# Patient Record
Sex: Female | Born: 1940 | Race: Black or African American | Hispanic: No | Marital: Single | Smoking: Never smoker
Health system: Southern US, Community
[De-identification: ages and names within clinical notes are randomized; demographics above are authoritative.]

## PROBLEM LIST (undated history)

## (undated) DIAGNOSIS — R609 Edema, unspecified: Secondary | ICD-10-CM

## (undated) DIAGNOSIS — E119 Type 2 diabetes mellitus without complications: Secondary | ICD-10-CM

## (undated) DIAGNOSIS — E079 Disorder of thyroid, unspecified: Secondary | ICD-10-CM

## (undated) DIAGNOSIS — D8989 Other specified disorders involving the immune mechanism, not elsewhere classified: Secondary | ICD-10-CM

## (undated) DIAGNOSIS — D869 Sarcoidosis, unspecified: Secondary | ICD-10-CM

## (undated) DIAGNOSIS — K219 Gastro-esophageal reflux disease without esophagitis: Secondary | ICD-10-CM

## (undated) DIAGNOSIS — I10 Essential (primary) hypertension: Secondary | ICD-10-CM

## (undated) DIAGNOSIS — K589 Irritable bowel syndrome without diarrhea: Secondary | ICD-10-CM

## (undated) DIAGNOSIS — J449 Chronic obstructive pulmonary disease, unspecified: Secondary | ICD-10-CM

## (undated) DIAGNOSIS — IMO0002 Reserved for concepts with insufficient information to code with codable children: Secondary | ICD-10-CM

## (undated) DIAGNOSIS — M1990 Unspecified osteoarthritis, unspecified site: Secondary | ICD-10-CM

## (undated) DIAGNOSIS — I509 Heart failure, unspecified: Secondary | ICD-10-CM

## (undated) DIAGNOSIS — G4733 Obstructive sleep apnea (adult) (pediatric): Secondary | ICD-10-CM

## (undated) DIAGNOSIS — M79609 Pain in unspecified limb: Secondary | ICD-10-CM

## (undated) DIAGNOSIS — K5730 Diverticulosis of large intestine without perforation or abscess without bleeding: Secondary | ICD-10-CM

## (undated) DIAGNOSIS — E785 Hyperlipidemia, unspecified: Secondary | ICD-10-CM

## (undated) HISTORY — DX: Chronic obstructive pulmonary disease, unspecified: J44.9

## (undated) HISTORY — DX: Heart failure, unspecified: I50.9

## (undated) HISTORY — DX: Diverticulosis of large intestine without perforation or abscess without bleeding: K57.30

## (undated) HISTORY — DX: Irritable bowel syndrome, unspecified: K58.9

## (undated) HISTORY — DX: Obstructive sleep apnea (adult) (pediatric): G47.33

## (undated) HISTORY — PX: HERNIA REPAIR: SHX51

## (undated) HISTORY — DX: Unspecified osteoarthritis, unspecified site: M19.90

## (undated) HISTORY — DX: Pain in unspecified limb: M79.609

## (undated) HISTORY — DX: Hyperlipidemia, unspecified: E78.5

## (undated) HISTORY — DX: Essential (primary) hypertension: I10

## (undated) HISTORY — DX: Reserved for concepts with insufficient information to code with codable children: IMO0002

## (undated) HISTORY — DX: Sarcoidosis, unspecified: D86.9

## (undated) HISTORY — PX: ABDOMINAL HYSTERECTOMY: SHX81

## (undated) HISTORY — DX: Disorder of thyroid, unspecified: E07.9

## (undated) HISTORY — DX: Other specified disorders involving the immune mechanism, not elsewhere classified: D89.89

## (undated) HISTORY — DX: Type 2 diabetes mellitus without complications: E11.9

## (undated) HISTORY — DX: Gastro-esophageal reflux disease without esophagitis: K21.9

## (undated) HISTORY — DX: Edema, unspecified: R60.9

## (undated) HISTORY — PX: CHOLECYSTECTOMY: SHX55

---

## 1997-12-22 ENCOUNTER — Other Ambulatory Visit: Admission: RE | Admit: 1997-12-22 | Discharge: 1997-12-22 | Payer: Self-pay | Admitting: Family Medicine

## 2000-04-25 HISTORY — PX: THYROID SURGERY: SHX805

## 2003-04-26 HISTORY — PX: CARDIAC CATHETERIZATION: SHX172

## 2003-07-07 ENCOUNTER — Inpatient Hospital Stay (HOSPITAL_COMMUNITY): Admission: AD | Admit: 2003-07-07 | Discharge: 2003-07-08 | Payer: Self-pay | Admitting: Cardiovascular Disease

## 2005-08-27 IMAGING — CT CT CHEST W/ CM
1 of 2 series · 15 of 30 positions shown, 19 images · IV contrast ([ID] OMNI 300)
Comparison: none

CLINICAL DATA: Chest pain.  Negative cardiac cath on 07/07/03.  Had reaction of severe vomiting to previous contrast.  Preop treatment with 25 mg PO Benadryl, 60 mg PO prednisone and 40 mg PO Pepcid.  
 CT SCAN OF THE CHEST WITH CONTRAST PULMONARY EMBOLUS PROTOCOL
 The patient was injected with 150 cc of Omnipaque 300 and scans of the entire chest were made and showed no definite pulmonary emboli.  This study is not optimal due to the patient?s large size.  There is noted some cardiomegaly.  The major vessels appear normal.  No abnormal lymph nodes are seen.  There is some scarring associated with the right oblique fissure posteriorly.  The tracheobronchial tree appears to be patent.  Bony thorax shows degenerative hypertrophic spurs of the mid and lower thoracic spine.  No evidence of fracture or metastatic disease is seen associated with the thorax.  There is no pericardial or pleural effusion.  
 IMPRESSION
 No evidence of pulmonary emboli.  This study is not optimal due to the patient?s large size.  There is some thickening and scarring associated with the right oblique fissure.  Heart is moderately enlarged with no evidence of pericardial or pleural effusion.

[Series 2: — · axial · 0.66mm/px · z∈[-245,-25]mm · 15 of 199 slices shown, 19 images]
[im 12/199  mediastinal]
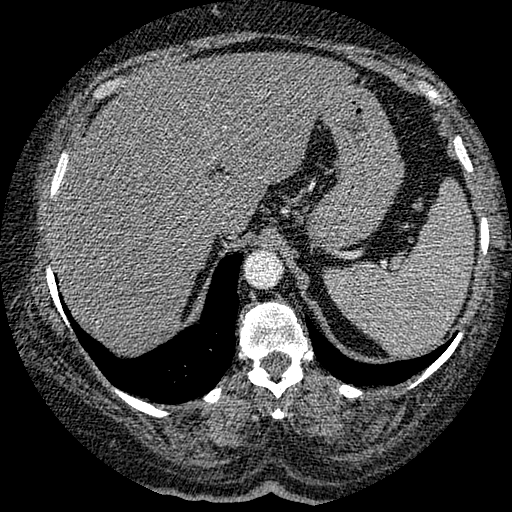
[im 12/199  lung]
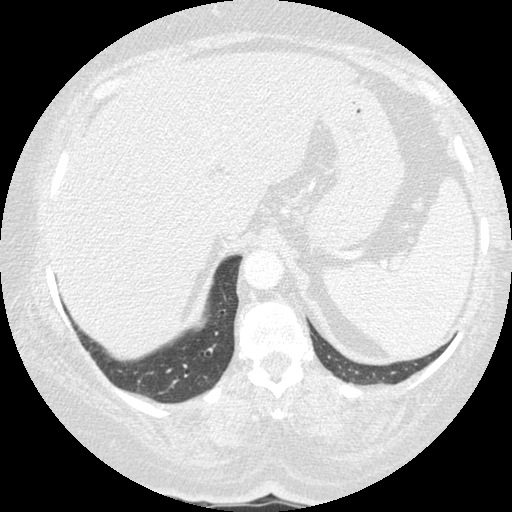
[im 23/199  lung]
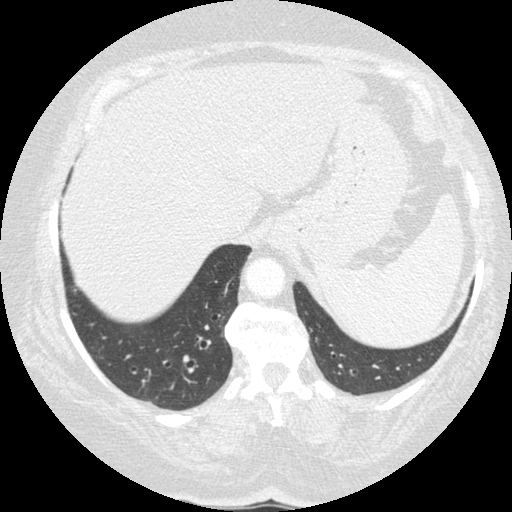
[im 45/199  lung]
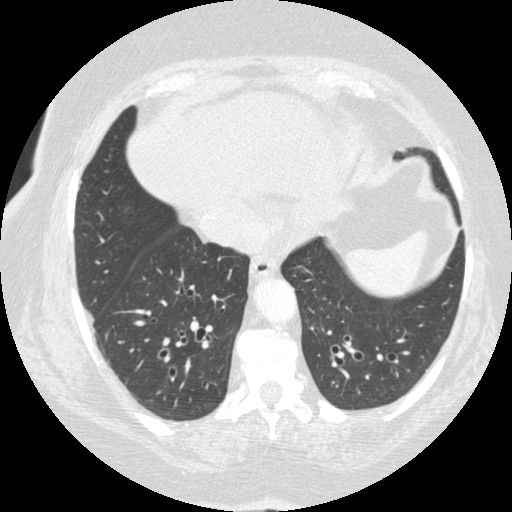
[im 56/199  lung]
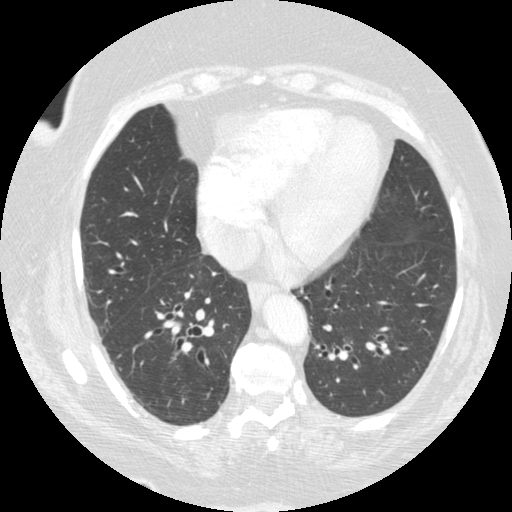
[im 67/199  mediastinal]
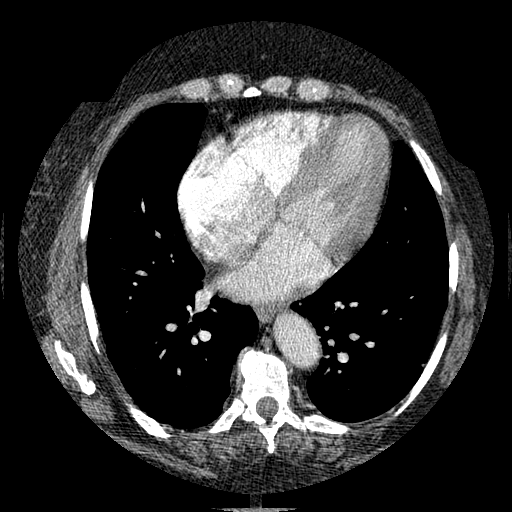
[im 67/199  lung]
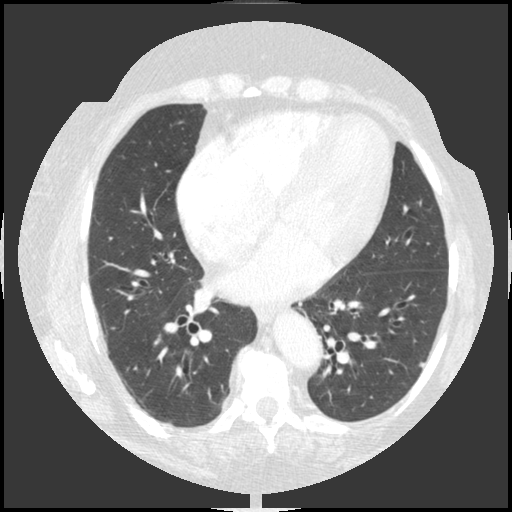
[im 78/199  lung]
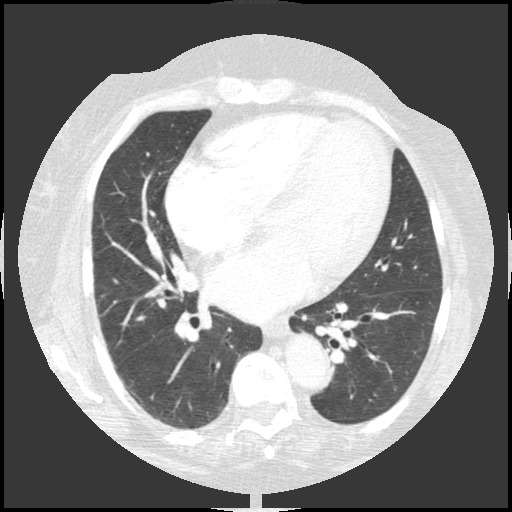
[im 89/199  lung]
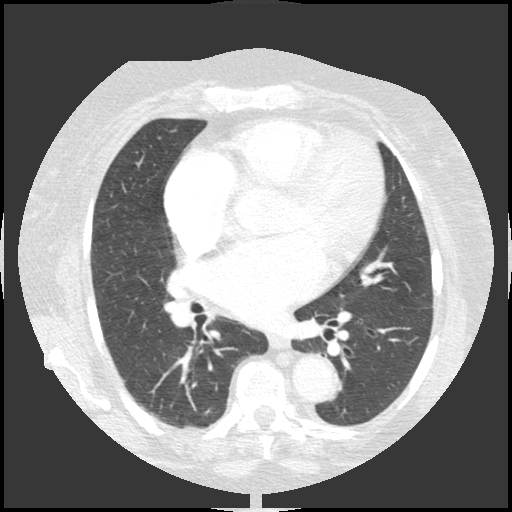
[im 100/199  lung]
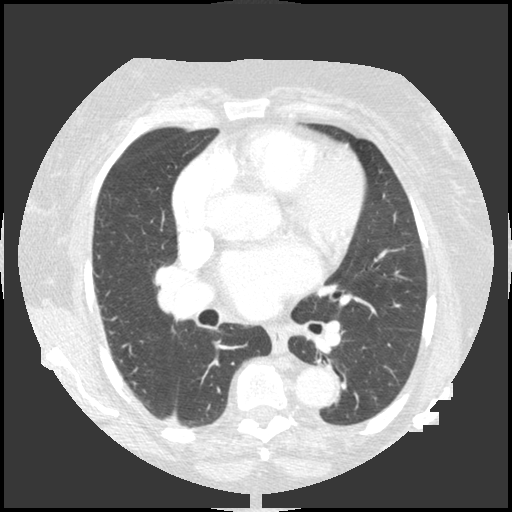
[im 111/199  mediastinal]
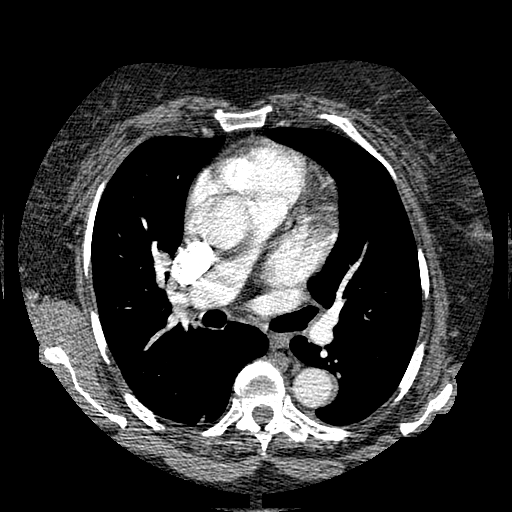
[im 111/199  lung]
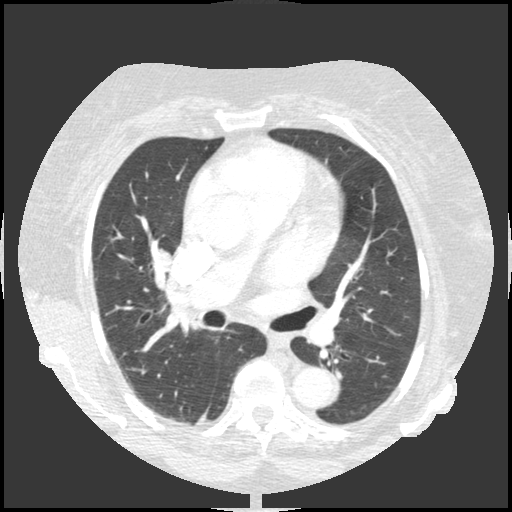
[im 122/199  lung]
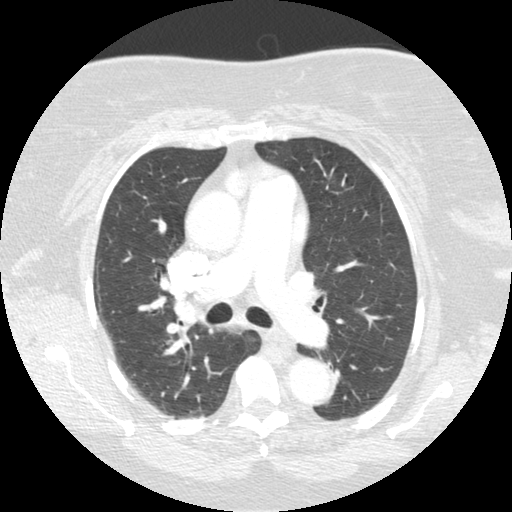
[im 133/199  lung]
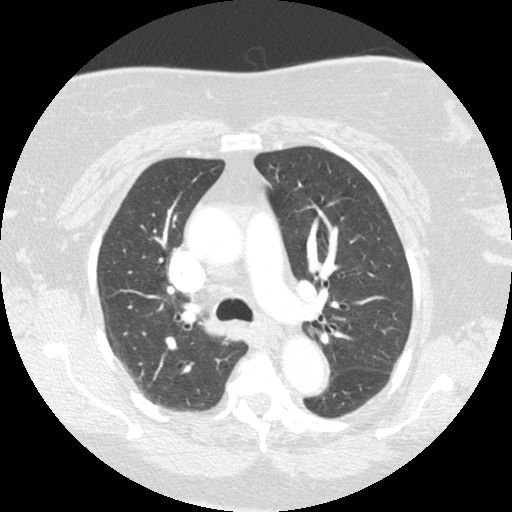
[im 144/199  lung]
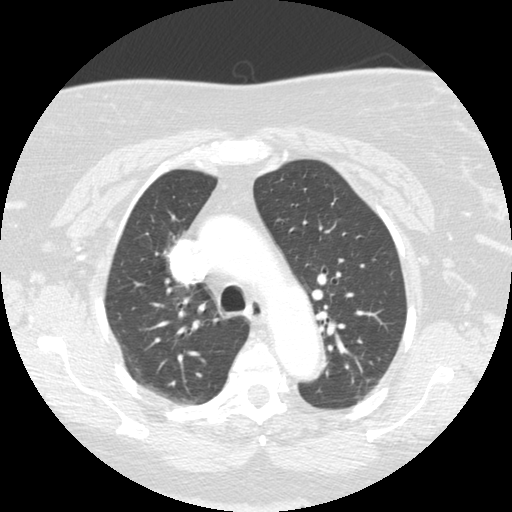
[im 166/199  mediastinal]
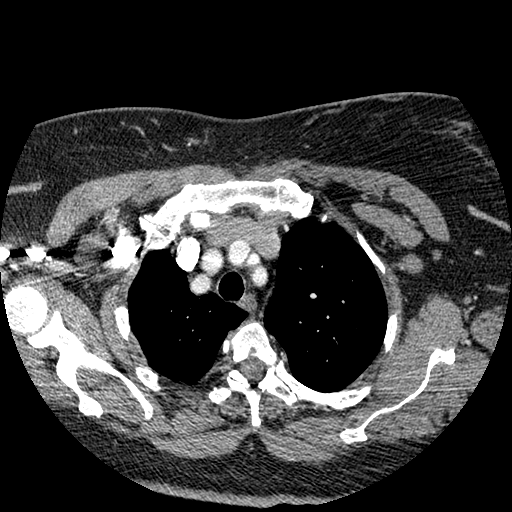
[im 166/199  lung]
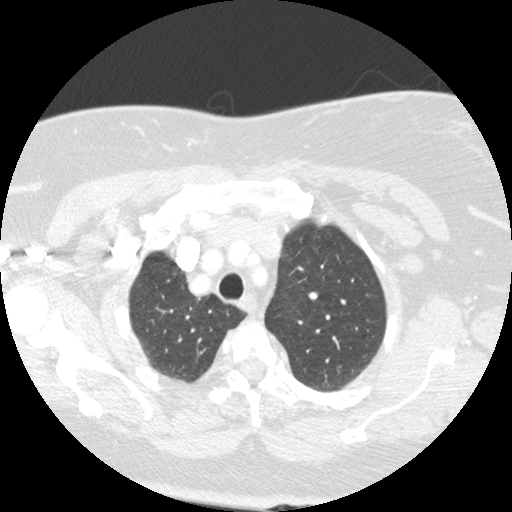
[im 177/199  lung]
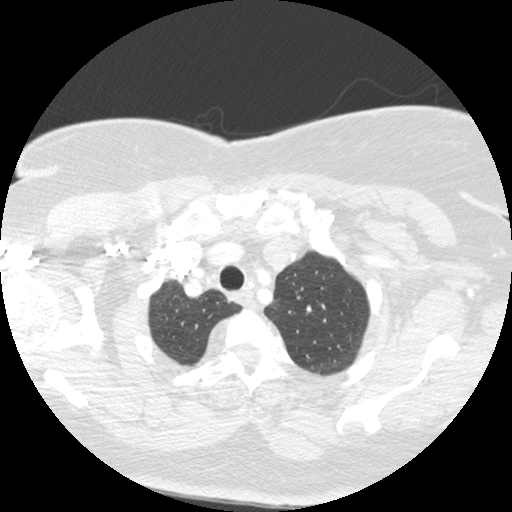
[im 188/199  lung]
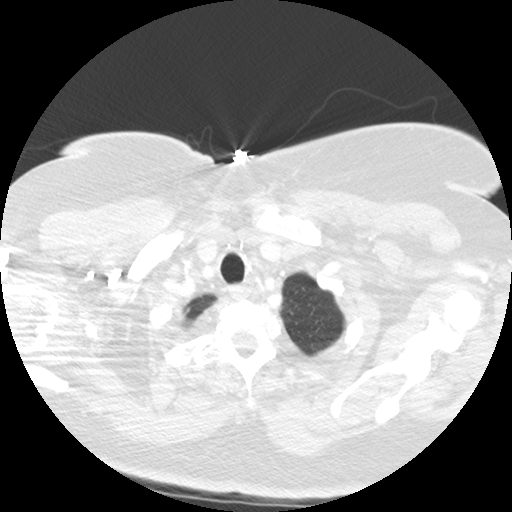

[15 of 30 positions shown; findings below may reference images not displayed]

## 2006-04-25 HISTORY — PX: JOINT REPLACEMENT: SHX530

## 2007-04-26 HISTORY — PX: JOINT REPLACEMENT: SHX530

## 2008-04-25 HISTORY — PX: CARPAL TUNNEL RELEASE: SHX101

## 2011-06-28 ENCOUNTER — Encounter: Payer: Self-pay | Admitting: Vascular Surgery

## 2011-07-01 ENCOUNTER — Encounter: Payer: Self-pay | Admitting: Vascular Surgery

## 2013-03-28 HISTORY — PX: BLADDER SUSPENSION: SHX72

## 2013-09-23 ENCOUNTER — Other Ambulatory Visit: Payer: Self-pay | Admitting: *Deleted

## 2013-09-23 DIAGNOSIS — M7989 Other specified soft tissue disorders: Secondary | ICD-10-CM

## 2013-11-04 ENCOUNTER — Encounter: Payer: Self-pay | Admitting: Vascular Surgery

## 2013-11-05 ENCOUNTER — Encounter: Payer: Self-pay | Admitting: Vascular Surgery

## 2013-11-05 ENCOUNTER — Ambulatory Visit (INDEPENDENT_AMBULATORY_CARE_PROVIDER_SITE_OTHER): Payer: Medicare Other | Admitting: Vascular Surgery

## 2013-11-05 ENCOUNTER — Ambulatory Visit (HOSPITAL_COMMUNITY)
Admission: RE | Admit: 2013-11-05 | Discharge: 2013-11-05 | Disposition: A | Discharge status: Home / Self Care | Payer: Medicare Other | Source: Ambulatory Visit | Attending: Vascular Surgery | Admitting: Vascular Surgery

## 2013-11-05 ENCOUNTER — Other Ambulatory Visit: Payer: Self-pay | Admitting: Vascular Surgery

## 2013-11-05 VITALS — BP 121/65 | HR 80 | Temp 98.4°F | Resp 18 | Ht 67.0 in | Wt 280.0 lb

## 2013-11-05 DIAGNOSIS — R600 Localized edema: Secondary | ICD-10-CM

## 2013-11-05 DIAGNOSIS — R609 Edema, unspecified: Secondary | ICD-10-CM

## 2013-11-05 DIAGNOSIS — M7989 Other specified soft tissue disorders: Secondary | ICD-10-CM

## 2013-11-05 DIAGNOSIS — I87303 Chronic venous hypertension (idiopathic) without complications of bilateral lower extremity: Secondary | ICD-10-CM | POA: Insufficient documentation

## 2013-11-05 NOTE — Progress Notes (Signed)
Subjective:     Patient ID: Madison Burnett, female   DOB: July 19, 1940, 73 y.o.   MRN: [id number]  HPI this 73 year old female is referred by Dr.Prochnau for evaluation of bilateral lower extremity edema. Patient has no history of DVT or thrombophlebitis. She dates that both legs have been increasingly swollen over the last several months. She does occasionally develop "cellulitis" in the right ankle. She currently does not have any infection. She does not elevate her legs a regular basis but does occasionally wear elastic compression stockings.  Past Medical History  Diagnosis Date  . Diabetes mellitus without complication   . Pain in limb   . Hypertension   . Edema   . Other specified disorders involving the immune mechanism   . Arthritis   . GERD (gastroesophageal reflux disease)   . OSA (obstructive sleep apnea)   . COPD (chronic obstructive pulmonary disease)   . Sarcoidosis   . IBS (irritable bowel syndrome)   . Hyperlipidemia   . Thyroid disease     Hypothyroidism  . Cystocele   . Diverticulosis of colon   . CHF (congestive heart failure)     Diastolic CHF    History  Substance Use Topics  . Smoking status: Never Smoker   . Smokeless tobacco: Not on file  . Alcohol Use: Not on file    Family History  Problem Relation Age of Onset  . Heart disease Father   . Cancer Son   . Diabetes Son     Allergies  Allergen Reactions  . Actos [Pioglitazone]   . Farxiga [Dapagliflozin]   . Metformin And Related   . Victoza [Liraglutide]     Current outpatient prescriptions:albuterol (PROVENTIL HFA;VENTOLIN HFA) 108 (90 BASE) MCG/ACT inhaler, Inhale into the lungs every 6 (six) hours as needed for wheezing or shortness of breath., Disp: , Rfl: ;  allopurinol (ZYLOPRIM) 300 MG tablet, Take 300 mg by mouth daily., Disp: , Rfl: ;  aspirin 81 MG tablet, Take 81 mg by mouth daily., Disp: , Rfl:  azelastine (ASTELIN) 0.1 % nasal spray, Place into both nostrils 2 (two) times daily. Use in  each nostril as directed, Disp: , Rfl: ;  EPINEPHrine (EPIPEN) 0.3 mg/0.3 mL IJ SOAJ injection, Inject into the muscle once., Disp: , Rfl: ;  Fe Fum-FA-B Cmp-C-Zn-Mg-Mn-Cu (FERROCITE PLUS) 106-1 MG TABS, Take by mouth., Disp: , Rfl: ;  FLUTICASONE PROPIONATE, INHAL, IN, Inhale into the lungs., Disp: , Rfl:  folic acid (FOLVITE) 1 MG tablet, Take 1 mg by mouth daily., Disp: , Rfl: ;  HYDROcodone-acetaminophen (NORCO/VICODIN) 5-325 MG per tablet, Take 1 tablet by mouth every 6 (six) hours as needed for moderate pain., Disp: , Rfl: ;  Insulin Aspart (NOVOLOG FLEXPEN [place]), Inject into the skin., Disp: , Rfl: ;  insulin detemir (LEVEMIR) 100 UNIT/ML injection, Inject 70 Units into the skin at bedtime., Disp: , Rfl:  isosorbide mononitrate (IMDUR) 60 MG 24 hr tablet, Take 60 mg by mouth daily., Disp: , Rfl: ;  levothyroxine (SYNTHROID, LEVOTHROID) 175 MCG tablet, Take 175 mcg by mouth daily before breakfast., Disp: , Rfl: ;  metoprolol succinate (TOPROL-XL) 100 MG 24 hr tablet, Take 100 mg by mouth daily. Take with or immediately following a meal., Disp: , Rfl: ;  omeprazole (PRILOSEC) 40 MG capsule, Take 40 mg by mouth daily., Disp: , Rfl:  polyethylene glycol (MIRALAX / GLYCOLAX) packet, Take 17 g by mouth daily., Disp: , Rfl: ;  ranitidine (ZANTAC) 300 MG tablet, Take 300 mg by  mouth at bedtime., Disp: , Rfl: ;  ranolazine (RANEXA) 500 MG 12 hr tablet, Take 500 mg by mouth 2 (two) times daily., Disp: , Rfl: ;  tizanidine (ZANAFLEX) 2 MG capsule, Take 2 mg by mouth 3 (three) times daily., Disp: , Rfl: ;  torsemide (DEMADEX) 100 MG tablet, Take 100 mg by mouth daily., Disp: , Rfl:  zolpidem (AMBIEN) 10 MG tablet, Take 10 mg by mouth at bedtime as needed for sleep., Disp: , Rfl:   BP 121/65  Pulse 80  Temp(Src) 98.4 F (36.9 C) (Oral)  Resp 18  Ht 5\' 7"  (1.702 m)  Wt 280 lb (127.007 kg)  BMI 43.84 kg/m2  SpO2 98%  Body mass index is 43.84 kg/(m^2).           Review of Systems as palpitations but  does complain of occasional chest pain, chest pressure, dyspnea on exertion, leg pain with walking, leg swelling, asthma, wheezing, weakness and numbness in arms and legs, skin rashes. Other systems negative complete review of systems     Objective:   Physical Exam BP 121/65  Pulse 80  Temp(Src) 98.4 F (36.9 C) (Oral)  Resp 18  Ht 5\' 7"  (1.702 m)  Wt 280 lb (127.007 kg)  BMI 43.84 kg/m2  SpO2 98%  Gen.-alert and oriented x3 in no apparent distress-obese HEENT normal for age Lungs no rhonchi or wheezing Cardiovascular regular rhythm no murmurs carotid pulses 3+ palpable no bruits audible Abdomen soft nontender no palpable masses Musculoskeletal free of  major deformities Skin clear -no rashes Neurologic normal Lower extremities 3+ femoral and dorsalis pedis pulses palpable bilaterally with 1+ edema bilaterally. No active ulcerations. No hyperpigmentation. No bulging varicosities noted. No evidence of cellulitis at the present time.  Today I ordered bilateral venous duplex exam which I reviewed and interpreted. There is some reflux in the left great saphenous and right small saphenous veins with borderline size veins. There is some deep reflux in both legs as well. There is no DVT.       Assessment:     Chronic bilateral edema right worse than left-etiology most likely deep venous reflux Patient does have some reflux and superficial venous system-left great saphenous and right small saphenous but I do not think laser ablation of these veins will significantly changed her situation at the present time.    Plan:     I recommended #1 elevate for the bed 4 inches so that she has good elevation all-night to help relieve the edema #2 short-leg elastic compression stockings to be placed on by her aid first laying in the morning before rising #3 no further vascular workup indicated #4 return to see me on a when necessary basis

## 2014-08-20 DIAGNOSIS — M1612 Unilateral primary osteoarthritis, left hip: Secondary | ICD-10-CM | POA: Insufficient documentation

## 2014-08-20 DIAGNOSIS — M1610 Unilateral primary osteoarthritis, unspecified hip: Secondary | ICD-10-CM | POA: Insufficient documentation

## 2014-10-06 NOTE — Patient Outreach (Signed)
[facility] Care Management  10/06/2014  Madison Burnett [birth year] [id number]   Referral and MD notes provided based from High Risk List, assigned [name], RN.  [name]. [name] [facility] Care Management [facility] CM Assistant Phone: [phone] Fax: [phone]

## 2014-10-17 DIAGNOSIS — Z01818 Encounter for other preprocedural examination: Secondary | ICD-10-CM | POA: Insufficient documentation

## 2014-10-17 DIAGNOSIS — M25552 Pain in left hip: Secondary | ICD-10-CM | POA: Insufficient documentation

## 2014-10-23 ENCOUNTER — Other Ambulatory Visit: Payer: Self-pay

## 2014-10-23 DIAGNOSIS — E119 Type 2 diabetes mellitus without complications: Secondary | ICD-10-CM | POA: Insufficient documentation

## 2014-10-23 DIAGNOSIS — N183 Chronic kidney disease, stage 3 unspecified: Secondary | ICD-10-CM | POA: Insufficient documentation

## 2014-10-23 DIAGNOSIS — I2510 Atherosclerotic heart disease of native coronary artery without angina pectoris: Secondary | ICD-10-CM | POA: Insufficient documentation

## 2014-10-23 DIAGNOSIS — I5032 Chronic diastolic (congestive) heart failure: Secondary | ICD-10-CM | POA: Insufficient documentation

## 2014-10-23 DIAGNOSIS — I129 Hypertensive chronic kidney disease with stage 1 through stage 4 chronic kidney disease, or unspecified chronic kidney disease: Secondary | ICD-10-CM | POA: Insufficient documentation

## 2014-10-23 DIAGNOSIS — E785 Hyperlipidemia, unspecified: Secondary | ICD-10-CM | POA: Insufficient documentation

## 2014-10-23 NOTE — Patient Outreach (Addendum)
[facility]) Care Management  10/23/2014  Madison AversClara Burnett 07/26/1940 [id number]   SUBJECTIVE:  Telephone call to patient regarding high risk list.  HIPAA verified with patient. Discussed and [facility]. Patient states she is presently receiving services from [facility].  Patient refused services at this time.  Patient verbalized agreement to receive [facility] outreach letter and pamphlet.     PLAN: RNCM will refer patient to Nena PolioLisa Moore to close due to refusal of services. RNCM will notify Dr. Sudie BaileyProchnau of patients refusal of services. RNCM will send patient outreach letter and pamphlet as requested.   George InaDavina Jasmyn Picha RN,BSN,CCM [facility] Telephonic Care Coordinator [phone]

## 2014-10-29 NOTE — Patient Outreach (Signed)
[facility]) Care Management  10/29/2014  Jessee AversClara Strohm 09/15/1940 [id number]   Notification from George Inaavina Green, RN to close case due to patient refused [facility].  Corrie MckusickLisa O. Sharlee BlewMoore, AABA [facility] CM Assistant Phone: [phone] Fax: [phone]

## 2014-11-13 DIAGNOSIS — R269 Unspecified abnormalities of gait and mobility: Secondary | ICD-10-CM | POA: Insufficient documentation

## 2015-01-30 DIAGNOSIS — M25571 Pain in right ankle and joints of right foot: Secondary | ICD-10-CM | POA: Insufficient documentation

## 2015-02-07 DIAGNOSIS — M51369 Other intervertebral disc degeneration, lumbar region without mention of lumbar back pain or lower extremity pain: Secondary | ICD-10-CM | POA: Insufficient documentation

## 2015-02-07 DIAGNOSIS — M4726 Other spondylosis with radiculopathy, lumbar region: Secondary | ICD-10-CM | POA: Insufficient documentation

## 2015-02-07 DIAGNOSIS — M5416 Radiculopathy, lumbar region: Secondary | ICD-10-CM | POA: Insufficient documentation

## 2015-02-07 DIAGNOSIS — M5136 Other intervertebral disc degeneration, lumbar region: Secondary | ICD-10-CM | POA: Insufficient documentation

## 2015-05-24 DIAGNOSIS — K589 Irritable bowel syndrome without diarrhea: Secondary | ICD-10-CM | POA: Insufficient documentation

## 2015-05-24 DIAGNOSIS — M9080 Osteopathy in diseases classified elsewhere, unspecified site: Secondary | ICD-10-CM

## 2015-05-24 DIAGNOSIS — M109 Gout, unspecified: Secondary | ICD-10-CM | POA: Insufficient documentation

## 2015-05-24 DIAGNOSIS — I2510 Atherosclerotic heart disease of native coronary artery without angina pectoris: Secondary | ICD-10-CM | POA: Insufficient documentation

## 2015-05-24 DIAGNOSIS — E1142 Type 2 diabetes mellitus with diabetic polyneuropathy: Secondary | ICD-10-CM | POA: Insufficient documentation

## 2015-05-24 DIAGNOSIS — J45909 Unspecified asthma, uncomplicated: Secondary | ICD-10-CM | POA: Insufficient documentation

## 2015-05-24 DIAGNOSIS — M2010 Hallux valgus (acquired), unspecified foot: Secondary | ICD-10-CM | POA: Insufficient documentation

## 2015-05-24 DIAGNOSIS — E876 Hypokalemia: Secondary | ICD-10-CM | POA: Insufficient documentation

## 2015-05-24 DIAGNOSIS — Z9989 Dependence on other enabling machines and devices: Secondary | ICD-10-CM

## 2015-05-24 DIAGNOSIS — K649 Unspecified hemorrhoids: Secondary | ICD-10-CM | POA: Insufficient documentation

## 2015-05-24 DIAGNOSIS — D869 Sarcoidosis, unspecified: Secondary | ICD-10-CM | POA: Insufficient documentation

## 2015-05-24 DIAGNOSIS — R609 Edema, unspecified: Secondary | ICD-10-CM | POA: Insufficient documentation

## 2015-05-24 DIAGNOSIS — E6609 Other obesity due to excess calories: Secondary | ICD-10-CM | POA: Insufficient documentation

## 2015-05-24 DIAGNOSIS — E782 Mixed hyperlipidemia: Secondary | ICD-10-CM | POA: Insufficient documentation

## 2015-05-24 DIAGNOSIS — B351 Tinea unguium: Secondary | ICD-10-CM | POA: Insufficient documentation

## 2015-05-24 DIAGNOSIS — B353 Tinea pedis: Secondary | ICD-10-CM | POA: Insufficient documentation

## 2015-05-24 DIAGNOSIS — G4733 Obstructive sleep apnea (adult) (pediatric): Secondary | ICD-10-CM | POA: Insufficient documentation

## 2015-05-24 DIAGNOSIS — E559 Vitamin D deficiency, unspecified: Secondary | ICD-10-CM | POA: Insufficient documentation

## 2015-05-24 DIAGNOSIS — E889 Metabolic disorder, unspecified: Secondary | ICD-10-CM | POA: Insufficient documentation

## 2015-05-24 DIAGNOSIS — E039 Hypothyroidism, unspecified: Secondary | ICD-10-CM | POA: Insufficient documentation

## 2015-05-24 DIAGNOSIS — R002 Palpitations: Secondary | ICD-10-CM | POA: Insufficient documentation

## 2015-05-24 DIAGNOSIS — I5030 Unspecified diastolic (congestive) heart failure: Secondary | ICD-10-CM | POA: Insufficient documentation

## 2015-05-24 DIAGNOSIS — M898X9 Other specified disorders of bone, unspecified site: Secondary | ICD-10-CM | POA: Insufficient documentation

## 2015-05-24 DIAGNOSIS — N189 Chronic kidney disease, unspecified: Secondary | ICD-10-CM | POA: Insufficient documentation

## 2015-05-24 DIAGNOSIS — J449 Chronic obstructive pulmonary disease, unspecified: Secondary | ICD-10-CM | POA: Insufficient documentation

## 2015-05-24 DIAGNOSIS — M2040 Other hammer toe(s) (acquired), unspecified foot: Secondary | ICD-10-CM | POA: Insufficient documentation

## 2015-05-26 DIAGNOSIS — Z79899 Other long term (current) drug therapy: Secondary | ICD-10-CM | POA: Insufficient documentation

## 2015-05-26 DIAGNOSIS — R002 Palpitations: Secondary | ICD-10-CM | POA: Insufficient documentation

## 2015-05-27 DIAGNOSIS — F5101 Primary insomnia: Secondary | ICD-10-CM | POA: Insufficient documentation

## 2015-07-02 ENCOUNTER — Ambulatory Visit (INDEPENDENT_AMBULATORY_CARE_PROVIDER_SITE_OTHER): Payer: Medicare Other | Admitting: Sports Medicine

## 2015-07-02 ENCOUNTER — Encounter: Payer: Self-pay | Admitting: Sports Medicine

## 2015-07-02 DIAGNOSIS — B351 Tinea unguium: Secondary | ICD-10-CM | POA: Diagnosis not present

## 2015-07-02 DIAGNOSIS — E1142 Type 2 diabetes mellitus with diabetic polyneuropathy: Secondary | ICD-10-CM | POA: Diagnosis not present

## 2015-07-02 DIAGNOSIS — M79671 Pain in right foot: Secondary | ICD-10-CM | POA: Diagnosis not present

## 2015-07-02 DIAGNOSIS — M79676 Pain in unspecified toe(s): Secondary | ICD-10-CM

## 2015-07-02 DIAGNOSIS — M79672 Pain in left foot: Secondary | ICD-10-CM | POA: Diagnosis not present

## 2015-07-02 DIAGNOSIS — I739 Peripheral vascular disease, unspecified: Secondary | ICD-10-CM | POA: Diagnosis not present

## 2015-07-02 NOTE — Progress Notes (Signed)
Patient ID: Leotta Weingarten, female   DOB: 12-20-1940, 75 y.o.   MRN: [id number] Subjective: Emely Fahy is a 75 y.o. female patient with history of diabetes who presents to office today complaining of long, painful nails  while ambulating in shoes; unable to trim. Patient states that the glucose reading this morning was 96 mg/dl. Patient denies any new changes in medication or new problems. Patient denies any new cramping, numbness, burning or tingling in the legs.  Patient Active Problem List   Diagnosis Date Noted  . Bilateral leg edema 11/05/2013   Current Outpatient Prescriptions on File Prior to Visit  Medication Sig Dispense Refill  . albuterol (PROVENTIL HFA;VENTOLIN HFA) 108 (90 BASE) MCG/ACT inhaler Inhale into the lungs every 6 (six) hours as needed for wheezing or shortness of breath.    . allopurinol (ZYLOPRIM) 300 MG tablet Take 300 mg by mouth daily.    Marland Kitchen aspirin 81 MG tablet Take 81 mg by mouth daily.    Marland Kitchen azelastine (ASTELIN) 0.1 % nasal spray Place into both nostrils 2 (two) times daily. Use in each nostril as directed    . EPINEPHrine (EPIPEN) 0.3 mg/0.3 mL IJ SOAJ injection Inject into the muscle once.    . Fe Fum-FA-B Cmp-C-Zn-Mg-Mn-Cu (FERROCITE PLUS) 106-1 MG TABS Take by mouth.    Marland Kitchen FLUTICASONE PROPIONATE, INHAL, IN Inhale into the lungs.    . folic acid (FOLVITE) 1 MG tablet Take 1 mg by mouth daily.    Marland Kitchen HYDROcodone-acetaminophen (NORCO/VICODIN) 5-325 MG per tablet Take 1 tablet by mouth every 6 (six) hours as needed for moderate pain.    . Insulin Aspart (NOVOLOG FLEXPEN [place]) Inject into the skin.    Marland Kitchen insulin detemir (LEVEMIR) 100 UNIT/ML injection Inject 70 Units into the skin at bedtime.    . isosorbide mononitrate (IMDUR) 60 MG 24 hr tablet Take 60 mg by mouth daily.    Marland Kitchen levothyroxine (SYNTHROID, LEVOTHROID) 175 MCG tablet Take 175 mcg by mouth daily before breakfast.    . metoprolol succinate (TOPROL-XL) 100 MG 24 hr tablet Take 100 mg by mouth daily. Take with or  immediately following a meal.    . omeprazole (PRILOSEC) 40 MG capsule Take 40 mg by mouth daily.    . polyethylene glycol (MIRALAX / GLYCOLAX) packet Take 17 g by mouth daily.    . ranitidine (ZANTAC) 300 MG tablet Take 300 mg by mouth at bedtime.    . ranolazine (RANEXA) 500 MG 12 hr tablet Take 500 mg by mouth 2 (two) times daily.    . tizanidine (ZANAFLEX) 2 MG capsule Take 2 mg by mouth 3 (three) times daily.    Marland Kitchen torsemide (DEMADEX) 100 MG tablet Take 100 mg by mouth daily.    Marland Kitchen zolpidem (AMBIEN) 10 MG tablet Take 10 mg by mouth at bedtime as needed for sleep.     No current facility-administered medications on file prior to visit.   Allergies  Allergen Reactions  . Actos [Pioglitazone] Other (See Comments)    Bloating  . Marcelline Deist [Dapagliflozin] Nausea Only and Other (See Comments)    Bloating  . Metformin Nausea And Vomiting and Other (See Comments)    Bloating  . Metformin And Related   . Shellfish-Derived Products     States shrimp makes her sick.  Other reaction(s): NAUSEA, VOMITING  . Victoza [Liraglutide] Diarrhea and Other (See Comments)    Problems with pancreas    No results found for this or any previous visit (from the past 2160  hour(s)).  Objective: General: Patient is awake, alert, and oriented x 3 and in no acute distress.  Integument: Skin is warm, dry and supple bilateral. Nails are tender, long, thickened and  dystrophic with subungual debris, consistent with onychomycosis, 1-5 bilateral. No signs of infection. No open lesions or preulcerative lesions present bilateral. Remaining integument unremarkable.  Vasculature:  Dorsalis Pedis pulse 1/4 bilateral. Posterior Tibial pulse  1/4 bilateral.  Capillary fill time <3 sec 1-5 bilateral. Scant hair growth to the level of the digits. Temperature gradient within normal limits. No varicosities present bilateral, + hyperpigmentation bilateral. No edema present bilateral.   Neurology: The patient has diminished  sensation measured with a 5.07/10g Semmes Weinstein Monofilament at all pedal sites bilateral . Vibratory sensation diminished bilateral with tuning fork. No Babinski sign present bilateral.   Musculoskeletal: Mild hammetoe pedal deformities noted bilateral. Muscular strength 5/5 in all lower extremity muscular groups bilateral without pain on range of motion . No tenderness with calf compression bilateral.  Assessment and Plan: Problem List Items Addressed This Visit    None    Visit Diagnoses    Pain due to onychomycosis of toenail    -  Primary    Diabetic polyneuropathy associated with type 2 diabetes mellitus (HCC)        PVD (peripheral vascular disease) (HCC)        Foot pain, bilateral          -Examined patient. -Discussed and educated patient on diabetic foot care, especially with  regards to the vascular, neurological and musculoskeletal systems.  -Stressed the importance of good glycemic control and the detriment of not  controlling glucose levels in relation to the foot. -Mechanically debrided all nails 1-5 bilateral using sterile nail nipper and filed with dremel without incident  -Answered all patient questions -Patient to return in 3 months for at risk foot care -Patient advised to call the office if any problems or questions arise in the meantime.  Asencion Islamitorya Ailyne Pawley, DPM

## 2015-07-02 NOTE — Patient Instructions (Signed)
Icy hot with lidocaine for foot pain  Okeeffe's healthy feet for dry skin on feet

## 2015-09-28 DIAGNOSIS — G5602 Carpal tunnel syndrome, left upper limb: Secondary | ICD-10-CM | POA: Insufficient documentation

## 2015-10-07 ENCOUNTER — Ambulatory Visit (INDEPENDENT_AMBULATORY_CARE_PROVIDER_SITE_OTHER): Payer: Medicare Other | Admitting: Sports Medicine

## 2015-10-07 ENCOUNTER — Encounter: Payer: Self-pay | Admitting: Sports Medicine

## 2015-10-07 DIAGNOSIS — I739 Peripheral vascular disease, unspecified: Secondary | ICD-10-CM

## 2015-10-07 DIAGNOSIS — M79676 Pain in unspecified toe(s): Secondary | ICD-10-CM | POA: Diagnosis not present

## 2015-10-07 DIAGNOSIS — M79672 Pain in left foot: Secondary | ICD-10-CM

## 2015-10-07 DIAGNOSIS — B351 Tinea unguium: Secondary | ICD-10-CM | POA: Diagnosis not present

## 2015-10-07 DIAGNOSIS — E1142 Type 2 diabetes mellitus with diabetic polyneuropathy: Secondary | ICD-10-CM

## 2015-10-07 DIAGNOSIS — M79671 Pain in right foot: Secondary | ICD-10-CM

## 2015-10-07 NOTE — Progress Notes (Signed)
Patient ID: Rodneshia Greenhouse, female   DOB: 1940/08/19, 75 y.o.   MRN: [id number]   Subjective: Coretta Leisey is a 75 y.o. female patient with history of diabetes who returns to office today complaining of long, painful nails  while ambulating in shoes; unable to trim. Patient states that the glucose reading this morning was 87 mg/dl. Patient denies any new changes in medication or new problems. Patient denies any new cramping, numbness, burning or tingling in the legs.  Patient Active Problem List   Diagnosis Date Noted  . Bilateral leg edema 11/05/2013   Current Outpatient Prescriptions on File Prior to Visit  Medication Sig Dispense Refill  . albuterol (PROVENTIL HFA;VENTOLIN HFA) 108 (90 BASE) MCG/ACT inhaler Inhale into the lungs every 6 (six) hours as needed for wheezing or shortness of breath.    . allopurinol (ZYLOPRIM) 300 MG tablet Take 300 mg by mouth daily.    Marland Kitchen aspirin 81 MG tablet Take 81 mg by mouth daily.    Marland Kitchen azelastine (ASTELIN) 0.1 % nasal spray Place into both nostrils 2 (two) times daily. Use in each nostril as directed    . EPINEPHrine (EPIPEN) 0.3 mg/0.3 mL IJ SOAJ injection Inject into the muscle once.    . Fe Fum-FA-B Cmp-C-Zn-Mg-Mn-Cu (FERROCITE PLUS) 106-1 MG TABS Take by mouth.    Marland Kitchen FLUTICASONE PROPIONATE, INHAL, IN Inhale into the lungs.    . folic acid (FOLVITE) 1 MG tablet Take 1 mg by mouth daily.    Marland Kitchen HYDROcodone-acetaminophen (NORCO/VICODIN) 5-325 MG per tablet Take 1 tablet by mouth every 6 (six) hours as needed for moderate pain.    . Insulin Aspart (NOVOLOG FLEXPEN [place]) Inject into the skin.    Marland Kitchen insulin detemir (LEVEMIR) 100 UNIT/ML injection Inject 70 Units into the skin at bedtime.    . isosorbide mononitrate (IMDUR) 60 MG 24 hr tablet Take 60 mg by mouth daily.    Marland Kitchen levothyroxine (SYNTHROID, LEVOTHROID) 175 MCG tablet Take 175 mcg by mouth daily before breakfast.    . metoprolol succinate (TOPROL-XL) 100 MG 24 hr tablet Take 100 mg by mouth daily. Take with or  immediately following a meal.    . omeprazole (PRILOSEC) 40 MG capsule Take 40 mg by mouth daily.    . polyethylene glycol (MIRALAX / GLYCOLAX) packet Take 17 g by mouth daily.    . ranitidine (ZANTAC) 300 MG tablet Take 300 mg by mouth at bedtime.    . ranolazine (RANEXA) 500 MG 12 hr tablet Take 500 mg by mouth 2 (two) times daily.    . tizanidine (ZANAFLEX) 2 MG capsule Take 2 mg by mouth 3 (three) times daily.    Marland Kitchen torsemide (DEMADEX) 100 MG tablet Take 100 mg by mouth daily.    Marland Kitchen zolpidem (AMBIEN) 10 MG tablet Take 10 mg by mouth at bedtime as needed for sleep.     No current facility-administered medications on file prior to visit.   Allergies  Allergen Reactions  . Actos [Pioglitazone] Other (See Comments)    Bloating  . Marcelline Deist [Dapagliflozin] Nausea Only and Other (See Comments)    Bloating  . Metformin Nausea And Vomiting and Other (See Comments)    Bloating  . Metformin And Related   . Shellfish-Derived Products     States shrimp makes her sick.  Other reaction(s): NAUSEA, VOMITING  . Victoza [Liraglutide] Diarrhea and Other (See Comments)    Problems with pancreas    No results found for this or any previous visit (from the  past 2160 hour(s)).  Objective: General: Patient is awake, alert, and oriented x 3 and in no acute distress.  Integument: Skin is warm, dry and supple bilateral. Nails are tender, long, thickened and  dystrophic with subungual debris, consistent with onychomycosis, 1-5 bilateral. No signs of infection. No open lesions or preulcerative lesions present bilateral. Remaining integument unremarkable.  Vasculature:  Dorsalis Pedis pulse 1/4 bilateral. Posterior Tibial pulse  1/4 bilateral.  Capillary fill time <3 sec 1-5 bilateral. Scant hair growth to the level of the digits. Temperature gradient within normal limits. No varicosities present bilateral, + hyperpigmentation bilateral. No edema present bilateral.   Neurology: The patient has diminished  sensation measured with a 5.07/10g Semmes Weinstein Monofilament at all pedal sites bilateral . Vibratory sensation diminished bilateral with tuning fork. No Babinski sign present bilateral.   Musculoskeletal: Mild hammetoe pedal deformities noted bilateral. Muscular strength 5/5 in all lower extremity muscular groups bilateral without pain on range of motion . No tenderness with calf compression bilateral.  Assessment and Plan: Problem List Items Addressed This Visit    None    Visit Diagnoses    Pain due to onychomycosis of toenail    -  Primary    Diabetic polyneuropathy associated with type 2 diabetes mellitus (HCC)        PVD (peripheral vascular disease) (HCC)        Foot pain, bilateral          -Examined patient. -Discussed and educated patient on diabetic foot care, especially with  regards to the vascular, neurological and musculoskeletal systems.  -Stressed the importance of good glycemic control and the detriment of not  controlling glucose levels in relation to the foot. -Mechanically debrided all nails 1-5 bilateral using sterile nail nipper and filed with dremel without incident  -Answered all patient questions -Patient to return in 3 months for at risk foot care -Patient advised to call the office if any problems or questions arise in the meantime.  Asencion Islamitorya Florice Hindle, DPM

## 2015-12-01 DIAGNOSIS — R0789 Other chest pain: Secondary | ICD-10-CM | POA: Insufficient documentation

## 2016-01-07 ENCOUNTER — Ambulatory Visit (INDEPENDENT_AMBULATORY_CARE_PROVIDER_SITE_OTHER): Payer: Medicare Other | Admitting: Sports Medicine

## 2016-01-07 ENCOUNTER — Encounter: Payer: Self-pay | Admitting: Sports Medicine

## 2016-01-07 VITALS — Ht 67.0 in | Wt 225.0 lb

## 2016-01-07 DIAGNOSIS — B351 Tinea unguium: Secondary | ICD-10-CM

## 2016-01-07 DIAGNOSIS — E1142 Type 2 diabetes mellitus with diabetic polyneuropathy: Secondary | ICD-10-CM

## 2016-01-07 DIAGNOSIS — M79676 Pain in unspecified toe(s): Secondary | ICD-10-CM

## 2016-01-07 DIAGNOSIS — I739 Peripheral vascular disease, unspecified: Secondary | ICD-10-CM

## 2016-01-07 DIAGNOSIS — M79671 Pain in right foot: Secondary | ICD-10-CM

## 2016-01-07 DIAGNOSIS — M79672 Pain in left foot: Secondary | ICD-10-CM

## 2016-01-07 NOTE — Progress Notes (Signed)
Patient ID: Madison AversClara Laver, female   DOB: 04/20/1941, 75 y.o.   MRN: [id number]   Subjective: Madison Burnett is a 75 y.o. female patient with history of diabetes who returns to office today complaining of long, painful nails  while ambulating in shoes; unable to trim. Patient states that the glucose reading this morning was 200mg /dl. States that she's on prednisone and had a stress test on yesterday to evaluate her heart because she has difficulty speaking and looses her voice--the doctor wanted to make sure it's not coming from her heart. Patient denies any new changes in medication or new problems. Patient denies any new cramping, numbness, burning or tingling in the legs.  Patient Active Problem List   Diagnosis Date Noted  . Bilateral leg edema 11/05/2013   Current Outpatient Prescriptions on File Prior to Visit  Medication Sig Dispense Refill  . albuterol (PROVENTIL HFA;VENTOLIN HFA) 108 (90 BASE) MCG/ACT inhaler Inhale into the lungs every 6 (six) hours as needed for wheezing or shortness of breath.    . allopurinol (ZYLOPRIM) 300 MG tablet Take 300 mg by mouth daily.    Marland Kitchen. aspirin 81 MG tablet Take 81 mg by mouth daily.    Marland Kitchen. azelastine (ASTELIN) 0.1 % nasal spray Place into both nostrils 2 (two) times daily. Use in each nostril as directed    . EPINEPHrine (EPIPEN) 0.3 mg/0.3 mL IJ SOAJ injection Inject into the muscle once.    . Fe Fum-FA-B Cmp-C-Zn-Mg-Mn-Cu (FERROCITE PLUS) 106-1 MG TABS Take by mouth.    Marland Kitchen. FLUTICASONE PROPIONATE, INHAL, IN Inhale into the lungs.    . folic acid (FOLVITE) 1 MG tablet Take 1 mg by mouth daily.    Marland Kitchen. HYDROcodone-acetaminophen (NORCO/VICODIN) 5-325 MG per tablet Take 1 tablet by mouth every 6 (six) hours as needed for moderate pain.    . Insulin Aspart (NOVOLOG FLEXPEN [place]) Inject into the skin.    Marland Kitchen. insulin detemir (LEVEMIR) 100 UNIT/ML injection Inject 70 Units into the skin at bedtime.    . isosorbide mononitrate (IMDUR) 60 MG 24 hr tablet Take 60 mg by mouth  daily.    Marland Kitchen. levothyroxine (SYNTHROID, LEVOTHROID) 175 MCG tablet Take 175 mcg by mouth daily before breakfast.    . metoprolol succinate (TOPROL-XL) 100 MG 24 hr tablet Take 100 mg by mouth daily. Take with or immediately following a meal.    . omeprazole (PRILOSEC) 40 MG capsule Take 40 mg by mouth daily.    . polyethylene glycol (MIRALAX / GLYCOLAX) packet Take 17 g by mouth daily.    . ranitidine (ZANTAC) 300 MG tablet Take 300 mg by mouth at bedtime.    . ranolazine (RANEXA) 500 MG 12 hr tablet Take 500 mg by mouth 2 (two) times daily.    . tizanidine (ZANAFLEX) 2 MG capsule Take 2 mg by mouth 3 (three) times daily.    Marland Kitchen. torsemide (DEMADEX) 100 MG tablet Take 100 mg by mouth daily.    Marland Kitchen. zolpidem (AMBIEN) 10 MG tablet Take 10 mg by mouth at bedtime as needed for sleep.     No current facility-administered medications on file prior to visit.    Allergies  Allergen Reactions  . Actos [Pioglitazone] Other (See Comments)    Bloating  . Marcelline DeistFarxiga [Dapagliflozin] Nausea Only and Other (See Comments)    Bloating  . Metformin Nausea And Vomiting and Other (See Comments)    Bloating  . Metformin And Related   . Shellfish-Derived Products     States shrimp makes  her sick.  Other reaction(s): NAUSEA, VOMITING  . Victoza [Liraglutide] Diarrhea and Other (See Comments)    Problems with pancreas    No results found for this or any previous visit (from the past 2160 hour(s)).  Objective: General: Patient is awake, alert, and oriented x 3 and in no acute distress.  Integument: Skin is warm, dry and supple bilateral. Nails are tender, long, thickened and dystrophic with subungual debris, consistent with onychomycosis, 1-5 bilateral. No signs of infection. No open lesions or preulcerative lesions present bilateral. Remaining integument unremarkable.  Vasculature:  Dorsalis Pedis pulse 1/4 bilateral. Posterior Tibial pulse  1/4 bilateral. Capillary fill time <3 sec 1-5 bilateral. Scant hair growth  to the level of the digits.Temperature gradient within normal limits. No varicosities present bilateral, + hyperpigmentation bilateral. No edema present bilateral.   Neurology: The patient has diminished sensation measured with a 5.07/10g Semmes Weinstein Monofilament at all pedal sites bilateral . Vibratory sensation diminished bilateral with tuning fork. No Babinski sign present bilateral.   Musculoskeletal: Mild hammetoe pedal deformities noted bilateral. Muscular strength 5/5 in all lower extremity muscular groups bilateral without pain on range of motion . No tenderness with calf compression bilateral.  Assessment and Plan: Problem List Items Addressed This Visit    None    Visit Diagnoses    Pain due to onychomycosis of toenail    -  Primary   Diabetic polyneuropathy associated with type 2 diabetes mellitus (HCC)       PVD (peripheral vascular disease) (HCC)       Foot pain, bilateral         -Examined patient. -Discussed and educated patient on diabetic foot care, especially with  regards to the vascular, neurological and musculoskeletal systems.  -Stressed the importance of good glycemic control and the detriment of not  controlling glucose levels in relation to the foot. -Mechanically debrided all nails 1-5 bilateral using sterile nail nipper and filed with dremel without incident  -Continue with extra depth diabetic shoes for foot type -Answered all patient questions -Patient to return in 3 months for at risk foot care -Patient advised to call the office if any problems or questions arise in the meantime.  Asencion Islam, DPM

## 2016-04-05 DIAGNOSIS — Z9181 History of falling: Secondary | ICD-10-CM | POA: Insufficient documentation

## 2016-04-07 ENCOUNTER — Encounter: Payer: Self-pay | Admitting: Sports Medicine

## 2016-04-07 ENCOUNTER — Ambulatory Visit (INDEPENDENT_AMBULATORY_CARE_PROVIDER_SITE_OTHER): Payer: Medicare Other | Admitting: Sports Medicine

## 2016-04-07 DIAGNOSIS — M79671 Pain in right foot: Secondary | ICD-10-CM

## 2016-04-07 DIAGNOSIS — B351 Tinea unguium: Secondary | ICD-10-CM | POA: Diagnosis not present

## 2016-04-07 DIAGNOSIS — M79676 Pain in unspecified toe(s): Secondary | ICD-10-CM | POA: Diagnosis not present

## 2016-04-07 DIAGNOSIS — M79672 Pain in left foot: Secondary | ICD-10-CM

## 2016-04-07 DIAGNOSIS — I739 Peripheral vascular disease, unspecified: Secondary | ICD-10-CM

## 2016-04-07 DIAGNOSIS — E1142 Type 2 diabetes mellitus with diabetic polyneuropathy: Secondary | ICD-10-CM

## 2016-04-07 NOTE — Progress Notes (Signed)
Patient ID: Madison AversClara Burnett, female   DOB: 09/25/1940, 75 y.o.   MRN: [id number]   Subjective: Madison AversClara Burnett is a 75 y.o. female patient with history of diabetes who returns to office today complaining of long, painful nails  while ambulating in shoes; unable to trim. Patient states that the glucose reading this morning was 70 mg/dl. States that her skin is dry. Patient denies any new changes in medication or new problems. Patient denies any new cramping, numbness, burning or tingling in the legs.  Patient Active Problem List   Diagnosis Date Noted  . Bilateral leg edema 11/05/2013   Current Outpatient Prescriptions on File Prior to Visit  Medication Sig Dispense Refill  . albuterol (PROVENTIL HFA;VENTOLIN HFA) 108 (90 BASE) MCG/ACT inhaler Inhale into the lungs every 6 (six) hours as needed for wheezing or shortness of breath.    . allopurinol (ZYLOPRIM) 300 MG tablet Take 300 mg by mouth daily.    Marland Kitchen. aspirin 81 MG tablet Take 81 mg by mouth daily.    Marland Kitchen. azelastine (ASTELIN) 0.1 % nasal spray Place into both nostrils 2 (two) times daily. Use in each nostril as directed    . EPINEPHrine (EPIPEN) 0.3 mg/0.3 mL IJ SOAJ injection Inject into the muscle once.    . Fe Fum-FA-B Cmp-C-Zn-Mg-Mn-Cu (FERROCITE PLUS) 106-1 MG TABS Take by mouth.    Marland Kitchen. FLUTICASONE PROPIONATE, INHAL, IN Inhale into the lungs.    . folic acid (FOLVITE) 1 MG tablet Take 1 mg by mouth daily.    Marland Kitchen. HYDROcodone-acetaminophen (NORCO/VICODIN) 5-325 MG per tablet Take 1 tablet by mouth every 6 (six) hours as needed for moderate pain.    . Insulin Aspart (NOVOLOG FLEXPEN [place]) Inject into the skin.    Marland Kitchen. insulin detemir (LEVEMIR) 100 UNIT/ML injection Inject 70 Units into the skin at bedtime.    . isosorbide mononitrate (IMDUR) 60 MG 24 hr tablet Take 60 mg by mouth daily.    Marland Kitchen. levothyroxine (SYNTHROID, LEVOTHROID) 175 MCG tablet Take 175 mcg by mouth daily before breakfast.    . metoprolol succinate (TOPROL-XL) 100 MG 24 hr tablet Take 100 mg  by mouth daily. Take with or immediately following a meal.    . omeprazole (PRILOSEC) 40 MG capsule Take 40 mg by mouth daily.    . polyethylene glycol (MIRALAX / GLYCOLAX) packet Take 17 g by mouth daily.    . ranitidine (ZANTAC) 300 MG tablet Take 300 mg by mouth at bedtime.    . ranolazine (RANEXA) 500 MG 12 hr tablet Take 500 mg by mouth 2 (two) times daily.    . tizanidine (ZANAFLEX) 2 MG capsule Take 2 mg by mouth 3 (three) times daily.    Marland Kitchen. torsemide (DEMADEX) 100 MG tablet Take 100 mg by mouth daily.    Marland Kitchen. zolpidem (AMBIEN) 10 MG tablet Take 10 mg by mouth at bedtime as needed for sleep.     No current facility-administered medications on file prior to visit.    Allergies  Allergen Reactions  . Actos [Pioglitazone] Other (See Comments)    Bloating  . Marcelline DeistFarxiga [Dapagliflozin] Nausea Only and Other (See Comments)    Bloating  . Metformin Nausea And Vomiting and Other (See Comments)    Bloating  . Metformin And Related   . Shellfish-Derived Products     States shrimp makes her sick.  Other reaction(s): NAUSEA, VOMITING  . Victoza [Liraglutide] Diarrhea and Other (See Comments)    Problems with pancreas    No results found for  this or any previous visit (from the past 2160 hour(s)).  Objective: General: Patient is awake, alert, and oriented x 3 and in no acute distress.  Integument: Skin is warm, dry and supple bilateral. Nails are tender, long, thickened and dystrophic with subungual debris, consistent with onychomycosis, 1-5 bilateral. No signs of infection. No open lesions or preulcerative lesions present bilateral. Remaining integument unremarkable.  Vasculature:  Dorsalis Pedis pulse 1/4 bilateral. Posterior Tibial pulse  1/4 bilateral. Capillary fill time <3 sec 1-5 bilateral. Scant hair growth to the level of the digits.Temperature gradient within normal limits. No varicosities present bilateral, + hyperpigmentation bilateral. No edema present bilateral.   Neurology: The  patient has diminished sensation measured with a 5.07/10g Semmes Weinstein Monofilament at all pedal sites bilateral . Vibratory sensation diminished bilateral with tuning fork. No Babinski sign present bilateral.   Musculoskeletal: Mild hammetoe pedal deformities noted bilateral. Muscular strength 5/5 in all lower extremity muscular groups bilateral without pain on range of motion . No tenderness with calf compression bilateral.  Assessment and Plan: Problem List Items Addressed This Visit    None    Visit Diagnoses    Pain due to onychomycosis of toenail    -  Primary   Diabetic polyneuropathy associated with type 2 diabetes mellitus (HCC)       PVD (peripheral vascular disease) (HCC)       Foot pain, bilateral         -Examined patient. -Discussed and educated patient on diabetic foot care, especially with  regards to the vascular, neurological and musculoskeletal systems.  -Stressed the importance of good glycemic control and the detriment of not controlling glucose levels in relation to the foot. -Mechanically debrided all nails 1-5 bilateral using sterile nail nipper and filed with dremel without incident  -Continue with extra depth diabetic shoes for foot type -Answered all patient questions -Patient to return in 3 months for at risk foot care -Patient advised to call the office if any problems or questions arise in the meantime.  Asencion Islamitorya Merve Hotard, DPM

## 2016-07-06 ENCOUNTER — Ambulatory Visit: Payer: Medicare Other | Admitting: Sports Medicine

## 2016-07-08 ENCOUNTER — Encounter: Payer: Self-pay | Admitting: Sports Medicine

## 2016-07-08 ENCOUNTER — Ambulatory Visit (INDEPENDENT_AMBULATORY_CARE_PROVIDER_SITE_OTHER): Payer: Medicare Other | Admitting: Sports Medicine

## 2016-07-08 DIAGNOSIS — M79676 Pain in unspecified toe(s): Secondary | ICD-10-CM | POA: Diagnosis not present

## 2016-07-08 DIAGNOSIS — M79672 Pain in left foot: Secondary | ICD-10-CM

## 2016-07-08 DIAGNOSIS — B351 Tinea unguium: Secondary | ICD-10-CM

## 2016-07-08 DIAGNOSIS — M79671 Pain in right foot: Secondary | ICD-10-CM

## 2016-07-08 DIAGNOSIS — I739 Peripheral vascular disease, unspecified: Secondary | ICD-10-CM

## 2016-07-08 DIAGNOSIS — E1142 Type 2 diabetes mellitus with diabetic polyneuropathy: Secondary | ICD-10-CM

## 2016-07-08 NOTE — Progress Notes (Signed)
Patient ID: Madison Burnett, female   DOB: 02-12-1941, 76 y.o.   MRN: [id number]   Subjective: Madison Burnett is a 76 y.o. female patient with history of diabetes who returns to office today complaining of long, painful nails  while ambulating in shoes; unable to trim. Patient states that the glucose reading this morning was  "good". States that her skin is dry. Patient denies any new changes in medication or new problems. Patient denies any new cramping, numbness, burning or tingling in the legs.  Patient Active Problem List   Diagnosis Date Noted  . Bilateral leg edema 11/05/2013   Current Outpatient Prescriptions on File Prior to Visit  Medication Sig Dispense Refill  . albuterol (PROVENTIL HFA;VENTOLIN HFA) 108 (90 BASE) MCG/ACT inhaler Inhale into the lungs every 6 (six) hours as needed for wheezing or shortness of breath.    . allopurinol (ZYLOPRIM) 300 MG tablet Take 300 mg by mouth daily.    Madison Burnett aspirin 81 MG tablet Take 81 mg by mouth daily.    Madison Burnett azelastine (ASTELIN) 0.1 % nasal spray Place into both nostrils 2 (two) times daily. Use in each nostril as directed    . EPINEPHrine (EPIPEN) 0.3 mg/0.3 mL IJ SOAJ injection Inject into the muscle once.    . Fe Fum-FA-B Cmp-C-Zn-Mg-Mn-Cu (FERROCITE PLUS) 106-1 MG TABS Take by mouth.    Madison Burnett FLUTICASONE PROPIONATE, INHAL, IN Inhale into the lungs.    . folic acid (FOLVITE) 1 MG tablet Take 1 mg by mouth daily.    Madison Burnett HYDROcodone-acetaminophen (NORCO/VICODIN) 5-325 MG per tablet Take 1 tablet by mouth every 6 (six) hours as needed for moderate pain.    . Insulin Aspart (NOVOLOG FLEXPEN [place]) Inject into the skin.    Madison Burnett insulin detemir (LEVEMIR) 100 UNIT/ML injection Inject 70 Units into the skin at bedtime.    . isosorbide mononitrate (IMDUR) 60 MG 24 hr tablet Take 60 mg by mouth daily.    Madison Burnett levothyroxine (SYNTHROID, LEVOTHROID) 175 MCG tablet Take 175 mcg by mouth daily before breakfast.    . metoprolol succinate (TOPROL-XL) 100 MG 24 hr tablet Take 100 mg  by mouth daily. Take with or immediately following a meal.    . omeprazole (PRILOSEC) 40 MG capsule Take 40 mg by mouth daily.    . polyethylene glycol (MIRALAX / GLYCOLAX) packet Take 17 g by mouth daily.    . ranitidine (ZANTAC) 300 MG tablet Take 300 mg by mouth at bedtime.    . ranolazine (RANEXA) 500 MG 12 hr tablet Take 500 mg by mouth 2 (two) times daily.    . tizanidine (ZANAFLEX) 2 MG capsule Take 2 mg by mouth 3 (three) times daily.    Madison Burnett torsemide (DEMADEX) 100 MG tablet Take 100 mg by mouth daily.    Madison Burnett zolpidem (AMBIEN) 10 MG tablet Take 10 mg by mouth at bedtime as needed for sleep.     No current facility-administered medications on file prior to visit.    Allergies  Allergen Reactions  . Actos [Pioglitazone] Other (See Comments)    Bloating  . Madison Burnett [Dapagliflozin] Nausea Only and Other (See Comments)    Bloating  . Metformin Nausea And Vomiting and Other (See Comments)    Bloating  . Metformin And Related   . Shellfish-Derived Products     States shrimp makes her sick.  Other reaction(s): NAUSEA, VOMITING  . Victoza [Liraglutide] Diarrhea and Other (See Comments)    Problems with pancreas    No results found for  this or any previous visit (from the past 2160 hour(s)).  Objective: General: Patient is awake, alert, and oriented x 3 and in no acute distress.  Integument: Skin is warm, dry and supple bilateral. Nails are tender, long, thickened and dystrophic with subungual debris, consistent with onychomycosis, 1-5 bilateral. No signs of infection. No open lesions or preulcerative lesions present bilateral. Remaining integument unremarkable.  Vasculature:  Dorsalis Pedis pulse 1/4 bilateral. Posterior Tibial pulse  1/4 bilateral. Capillary fill time <3 sec 1-5 bilateral. Scant hair growth to the level of the digits.Temperature gradient within normal limits. No varicosities present bilateral, + hyperpigmentation bilateral. No edema present bilateral.   Neurology: The  patient has diminished sensation measured with a 5.07/10g Semmes Weinstein Monofilament at all pedal sites bilateral . Vibratory sensation diminished bilateral with tuning fork. No Babinski sign present bilateral.   Musculoskeletal: Mild hammetoe pedal deformities noted bilateral. Muscular strength 5/5 in all lower extremity muscular groups bilateral without pain on range of motion . No tenderness with calf compression bilateral.  Assessment and Plan: Problem List Items Addressed This Visit    None    Visit Diagnoses    Pain due to onychomycosis of toenail    -  Primary   Diabetic polyneuropathy associated with type 2 diabetes mellitus (HCC)       PVD (peripheral vascular disease) (HCC)       Foot pain, bilateral         -Examined patient. -Discussed and educated patient on diabetic foot care, especially with  regards to the vascular, neurological and musculoskeletal systems.  -Stressed the importance of good glycemic control and the detriment of not controlling glucose levels in relation to the foot. -Mechanically debrided all nails 1-5 bilateral using sterile nail nipper and filed with dremel without incident  -Recommend daily skin emollients for dryness  -Continue with extra depth diabetic shoes for foot type -Answered all patient questions -Patient to return in 3 months for at risk foot care -Patient advised to call the office if any problems or questions arise in the meantime.  Madison Islamitorya Lean Burnett, DPM

## 2016-07-14 DIAGNOSIS — D509 Iron deficiency anemia, unspecified: Secondary | ICD-10-CM | POA: Insufficient documentation

## 2016-09-06 DIAGNOSIS — M4316 Spondylolisthesis, lumbar region: Secondary | ICD-10-CM | POA: Insufficient documentation

## 2016-10-05 ENCOUNTER — Encounter: Payer: Self-pay | Admitting: Sports Medicine

## 2016-10-05 ENCOUNTER — Ambulatory Visit (INDEPENDENT_AMBULATORY_CARE_PROVIDER_SITE_OTHER): Payer: Medicare Other | Admitting: Sports Medicine

## 2016-10-05 DIAGNOSIS — M79676 Pain in unspecified toe(s): Secondary | ICD-10-CM | POA: Diagnosis not present

## 2016-10-05 DIAGNOSIS — B351 Tinea unguium: Secondary | ICD-10-CM | POA: Diagnosis not present

## 2016-10-05 DIAGNOSIS — I739 Peripheral vascular disease, unspecified: Secondary | ICD-10-CM

## 2016-10-05 DIAGNOSIS — E1142 Type 2 diabetes mellitus with diabetic polyneuropathy: Secondary | ICD-10-CM

## 2016-10-05 DIAGNOSIS — M79671 Pain in right foot: Secondary | ICD-10-CM

## 2016-10-05 DIAGNOSIS — L84 Corns and callosities: Secondary | ICD-10-CM | POA: Diagnosis not present

## 2016-10-05 DIAGNOSIS — K219 Gastro-esophageal reflux disease without esophagitis: Secondary | ICD-10-CM | POA: Insufficient documentation

## 2016-10-05 DIAGNOSIS — M79672 Pain in left foot: Secondary | ICD-10-CM

## 2016-10-05 NOTE — Progress Notes (Signed)
Patient ID: Madison Burnett, female   DOB: 04/18/1941, 76 y.o.   MRN: [id number]   Subjective: Madison Burnett is a 76 y.o. female patient with history of diabetes who returns to office today complaining of corn and long, painful nails  while ambulating in shoes; unable to trim. Patient states that the glucose reading this morning was  "good". States that her feet hurt and feel numb. Patient denies any new changes in medication or other new problems.   Patient Active Problem List   Diagnosis Date Noted  . GERD (gastroesophageal reflux disease) 10/05/2016  . Spondylolisthesis at L4-L5 level 09/06/2016  . Iron deficiency anemia 07/14/2016  . Risk for falls 04/05/2016  . Atypical chest pain 12/01/2015  . Carpal tunnel syndrome, left 09/28/2015  . Primary insomnia 05/27/2015  . CKD (chronic kidney disease), stage III 05/26/2015  . High risk medication use 05/26/2015  . Palpitations 05/26/2015  . Asthma 05/24/2015  . Chronic constipation 05/24/2015  . Chronic rhinitis 05/24/2015  . Coronary arteriosclerosis in native artery 05/24/2015  . Diabetic polyneuropathy associated with type 2 diabetes mellitus (HCC) 05/24/2015  . Diastolic congestive heart failure (HCC) 05/24/2015  . Edema 05/24/2015  . Gout 05/24/2015  . Hallux valgus 05/24/2015  . Hammer toe 05/24/2015  . Hemorrhoids 05/24/2015  . Hyperlipidemia 05/24/2015  . Hypokalemia 05/24/2015  . Hypothyroidism 05/24/2015  . Non morbid obesity due to excess calories 05/24/2015  . Obstructive sleep apnea 05/24/2015  . Onychomycosis 05/24/2015  . Osteoarthritis 05/24/2015  . Palpitation 05/24/2015  . Sarcoidosis 05/24/2015  . Tinea pedis of both feet 05/24/2015  . Vitamin D deficiency 05/24/2015  . Lumbar degenerative disc disease 02/07/2015  . Lumbar radiculopathy 02/07/2015  . Osteoarthritis of spine with radiculopathy, lumbar region 02/07/2015  . Acute right ankle pain 01/30/2015  . Abnormal gait 11/13/2014  . Chronic diastolic  congestive heart failure (HCC) 10/23/2014  . Coronary artery disease involving native coronary artery of native heart without angina pectoris 10/23/2014  . Dyslipidemia 10/23/2014  . Essential hypertension 10/23/2014  . Type 2 diabetes mellitus without complication (HCC) 10/23/2014  . Left hip pain 10/17/2014  . Preop examination 10/17/2014  . Degenerative joint disease of pelvic region 08/20/2014  . Primary osteoarthritis of left hip 08/20/2014  . Bilateral leg edema 11/05/2013   Current Outpatient Prescriptions on File Prior to Visit  Medication Sig Dispense Refill  . albuterol (PROVENTIL HFA;VENTOLIN HFA) 108 (90 BASE) MCG/ACT inhaler Inhale into the lungs every 6 (six) hours as needed for wheezing or shortness of breath.    . allopurinol (ZYLOPRIM) 300 MG tablet Take 300 mg by mouth daily.    Marland Kitchen aspirin 81 MG tablet Take 81 mg by mouth daily.    Marland Kitchen azelastine (ASTELIN) 0.1 % nasal spray Place into both nostrils 2 (two) times daily. Use in each nostril as directed    . EPINEPHrine (EPIPEN) 0.3 mg/0.3 mL IJ SOAJ injection Inject into the muscle once.    . Fe Fum-FA-B Cmp-C-Zn-Mg-Mn-Cu (FERROCITE PLUS) 106-1 MG TABS Take by mouth.    Marland Kitchen FLUTICASONE PROPIONATE, INHAL, IN Inhale into the lungs.    . folic acid (FOLVITE) 1 MG tablet Take 1 mg by mouth daily.    Marland Kitchen HYDROcodone-acetaminophen (NORCO/VICODIN) 5-325 MG per tablet Take 1 tablet by mouth every 6 (six) hours as needed for moderate pain.    . Insulin Aspart (NOVOLOG FLEXPEN [place]) Inject into the skin.    Marland Kitchen insulin detemir (LEVEMIR) 100 UNIT/ML injection Inject 70 Units into the skin at bedtime.    Marland Kitchen  isosorbide mononitrate (IMDUR) 60 MG 24 hr tablet Take 60 mg by mouth daily.    Marland Kitchen. levothyroxine (SYNTHROID, LEVOTHROID) 175 MCG tablet Take 175 mcg by mouth daily before breakfast.    . metoprolol succinate (TOPROL-XL) 100 MG 24 hr tablet Take 100 mg by mouth daily. Take with or immediately following a meal.    . omeprazole (PRILOSEC) 40 MG  capsule Take 40 mg by mouth daily.    . polyethylene glycol (MIRALAX / GLYCOLAX) packet Take 17 g by mouth daily.    . ranitidine (ZANTAC) 300 MG tablet Take 300 mg by mouth at bedtime.    . ranolazine (RANEXA) 500 MG 12 hr tablet Take 500 mg by mouth 2 (two) times daily.    . tizanidine (ZANAFLEX) 2 MG capsule Take 2 mg by mouth 3 (three) times daily.    Marland Kitchen. torsemide (DEMADEX) 100 MG tablet Take 100 mg by mouth daily.    Marland Kitchen. zolpidem (AMBIEN) 10 MG tablet Take 10 mg by mouth at bedtime as needed for sleep.     No current facility-administered medications on file prior to visit.    Allergies  Allergen Reactions  . Actos [Pioglitazone] Other (See Comments)    Bloating  . Marcelline DeistFarxiga [Dapagliflozin] Nausea Only and Other (See Comments)    Bloating  . Metformin Nausea And Vomiting and Other (See Comments)    Bloating  . Metformin And Related   . Shellfish-Derived Products     States shrimp makes her sick.  Other reaction(s): NAUSEA, VOMITING  . Victoza [Liraglutide] Diarrhea and Other (See Comments)    Problems with pancreas    No results found for this or any previous visit (from the past 2160 hour(s)).  Objective: General: Patient is awake, alert, and oriented x 3 and in no acute distress.  Integument: Skin is warm, dry and supple bilateral. Nails are tender, long, thickened and dystrophic with subungual debris, consistent with onychomycosis, 1-5 bilateral. No signs of infection. + minimal corn at bilateral 5th toes. Remaining integument unremarkable.  Vasculature:  Dorsalis Pedis pulse 1/4 bilateral. Posterior Tibial pulse  1/4 bilateral. Capillary fill time <3 sec 1-5 bilateral. Scant hair growth to the level of the digits.Temperature gradient within normal limits. No varicosities present bilateral, + hyperpigmentation bilateral. No edema present bilateral.   Neurology: The patient has diminished sensation measured with a 5.07/10g Semmes Weinstein Monofilament at all pedal sites bilateral  . Vibratory sensation diminished bilateral with tuning fork. No Babinski sign present bilateral.   Musculoskeletal: Mild hammetoe pedal deformities noted bilateral. Muscular strength 5/5 in all lower extremity muscular groups bilateral without pain on range of motion . No tenderness with calf compression bilateral.  Assessment and Plan: Problem List Items Addressed This Visit      Endocrine   Diabetic polyneuropathy associated with type 2 diabetes mellitus (HCC)    Other Visit Diagnoses    Pain due to onychomycosis of toenail    -  Primary   PVD (peripheral vascular disease) (HCC)       Foot pain, bilateral       Corns and callosities         -Examined patient. -Discussed and educated patient on diabetic foot care, especially with  regards to the vascular, neurological and musculoskeletal systems.  -Stressed the importance of good glycemic control and the detriment of not controlling glucose levels in relation to the foot. -Mechanically debrided corns at 5th toes using chisel blade and all nails 1-5 bilateral using sterile nail nipper and  filed with dremel without incident  -Recommend daily skin emollients for dryness  -Continue with extra depth diabetic shoes for foot type -Patient wants to hold off on pain rub/patch and toe cushions until she sees the doctor about her back; will wait on neurology referral until after her back has been taken care of -Answered all patient questions -Patient to return in 3 months for at risk foot care -Patient advised to call the office if any problems or questions arise in the meantime.  Asencion Islam, DPM

## 2017-01-06 ENCOUNTER — Ambulatory Visit: Payer: Medicare Other | Admitting: Sports Medicine

## 2017-01-19 ENCOUNTER — Ambulatory Visit (INDEPENDENT_AMBULATORY_CARE_PROVIDER_SITE_OTHER): Payer: Medicare Other | Admitting: Sports Medicine

## 2017-01-19 ENCOUNTER — Encounter (INDEPENDENT_AMBULATORY_CARE_PROVIDER_SITE_OTHER): Payer: Self-pay

## 2017-01-19 DIAGNOSIS — M79671 Pain in right foot: Secondary | ICD-10-CM

## 2017-01-19 DIAGNOSIS — I739 Peripheral vascular disease, unspecified: Secondary | ICD-10-CM | POA: Diagnosis not present

## 2017-01-19 DIAGNOSIS — B351 Tinea unguium: Secondary | ICD-10-CM | POA: Diagnosis not present

## 2017-01-19 DIAGNOSIS — E1142 Type 2 diabetes mellitus with diabetic polyneuropathy: Secondary | ICD-10-CM

## 2017-01-19 DIAGNOSIS — M79676 Pain in unspecified toe(s): Secondary | ICD-10-CM | POA: Diagnosis not present

## 2017-01-19 DIAGNOSIS — L84 Corns and callosities: Secondary | ICD-10-CM

## 2017-01-19 DIAGNOSIS — M79672 Pain in left foot: Secondary | ICD-10-CM

## 2017-01-19 NOTE — Progress Notes (Signed)
Patient ID: Madison Burnett, female   DOB: [birth year], 76 y.o.   MRN: [id number]   Subjective: Madison Burnett is a 76 y.o. female patient with history of diabetes who returns to office today complaining of corn and long, painful nails  while ambulating in shoes; unable to trim. Patient states that the glucose reading this morning was  215, has been high since she is on steroid. States that her feet hurt and feel numb, takes a pain pill which helps. Patient denies any new changes in medication or other new problems.   Patient Active Problem List   Diagnosis Date Noted  . GERD (gastroesophageal reflux disease) 10/05/2016  . Spondylolisthesis at L4-L5 level 09/06/2016  . Iron deficiency anemia 07/14/2016  . Risk for falls 04/05/2016  . Atypical chest pain 12/01/2015  . Carpal tunnel syndrome, left 09/28/2015  . Primary insomnia 05/27/2015  . CKD (chronic kidney disease), stage III 05/26/2015  . High risk medication use 05/26/2015  . Palpitations 05/26/2015  . Asthma 05/24/2015  . Chronic constipation 05/24/2015  . Chronic rhinitis 05/24/2015  . Coronary arteriosclerosis in native artery 05/24/2015  . Diabetic polyneuropathy associated with type 2 diabetes mellitus (HCC) 05/24/2015  . Diastolic congestive heart failure (HCC) 05/24/2015  . Edema 05/24/2015  . Gout 05/24/2015  . Hallux valgus 05/24/2015  . Hammer toe 05/24/2015  . Hemorrhoids 05/24/2015  . Hyperlipidemia 05/24/2015  . Hypokalemia 05/24/2015  . Hypothyroidism 05/24/2015  . Non morbid obesity due to excess calories 05/24/2015  . Obstructive sleep apnea 05/24/2015  . Onychomycosis 05/24/2015  . Osteoarthritis 05/24/2015  . Palpitation 05/24/2015  . Sarcoidosis 05/24/2015  . Tinea pedis of both feet 05/24/2015  . Vitamin D deficiency 05/24/2015  . Lumbar degenerative disc disease 02/07/2015  . Lumbar radiculopathy 02/07/2015  . Osteoarthritis of spine with radiculopathy, lumbar region 02/07/2015  . Acute right ankle pain  01/30/2015  . Abnormal gait 11/13/2014  . Chronic diastolic congestive heart failure (HCC) 10/23/2014  . Coronary artery disease involving native coronary artery of native heart without angina pectoris 10/23/2014  . Dyslipidemia 10/23/2014  . Essential hypertension 10/23/2014  . Type 2 diabetes mellitus without complication (HCC) 10/23/2014  . Left hip pain 10/17/2014  . Preop examination 10/17/2014  . Degenerative joint disease of pelvic region 08/20/2014  . Primary osteoarthritis of left hip 08/20/2014  . Bilateral leg edema 11/05/2013   Current Outpatient Prescriptions on File Prior to Visit  Medication Sig Dispense Refill  . albuterol (PROVENTIL HFA;VENTOLIN HFA) 108 (90 BASE) MCG/ACT inhaler Inhale into the lungs every 6 (six) hours as needed for wheezing or shortness of breath.    . allopurinol (ZYLOPRIM) 300 MG tablet Take 300 mg by mouth daily.    Marland Kitchen aspirin 81 MG tablet Take 81 mg by mouth daily.    Marland Kitchen azelastine (ASTELIN) 0.1 % nasal spray Place into both nostrils 2 (two) times daily. Use in each nostril as directed    . EPINEPHrine (EPIPEN) 0.3 mg/0.3 mL IJ SOAJ injection Inject into the muscle once.    . Fe Fum-FA-B Cmp-C-Zn-Mg-Mn-Cu (FERROCITE PLUS) 106-1 MG TABS Take by mouth.    Marland Kitchen FLUTICASONE PROPIONATE, INHAL, IN Inhale into the lungs.    . folic acid (FOLVITE) 1 MG tablet Take 1 mg by mouth daily.    Marland Kitchen HYDROcodone-acetaminophen (NORCO/VICODIN) 5-325 MG per tablet Take 1 tablet by mouth every 6 (six) hours as needed for moderate pain.    . Insulin Aspart (NOVOLOG FLEXPEN [place]) Inject into the skin.    Marland Kitchen  insulin detemir (LEVEMIR) 100 UNIT/ML injection Inject 70 Units into the skin at bedtime.    . isosorbide mononitrate (IMDUR) 60 MG 24 hr tablet Take 60 mg by mouth daily.    Marland Kitchen levothyroxine (SYNTHROID, LEVOTHROID) 175 MCG tablet Take 175 mcg by mouth daily before breakfast.    . metoprolol succinate (TOPROL-XL) 100 MG 24 hr tablet Take 100 mg by mouth daily. Take with or  immediately following a meal.    . omeprazole (PRILOSEC) 40 MG capsule Take 40 mg by mouth daily.    . polyethylene glycol (MIRALAX / GLYCOLAX) packet Take 17 g by mouth daily.    . ranitidine (ZANTAC) 300 MG tablet Take 300 mg by mouth at bedtime.    . ranolazine (RANEXA) 500 MG 12 hr tablet Take 500 mg by mouth 2 (two) times daily.    . tizanidine (ZANAFLEX) 2 MG capsule Take 2 mg by mouth 3 (three) times daily.    Marland Kitchen torsemide (DEMADEX) 100 MG tablet Take 100 mg by mouth daily.    Marland Kitchen zolpidem (AMBIEN) 10 MG tablet Take 10 mg by mouth at bedtime as needed for sleep.     No current facility-administered medications on file prior to visit.    Allergies  Allergen Reactions  . Actos [Pioglitazone] Other (See Comments)    Bloating  . Marcelline Deist [Dapagliflozin] Nausea Only and Other (See Comments)    Bloating  . Metformin Nausea And Vomiting and Other (See Comments)    Bloating  . Metformin And Related   . Shellfish-Derived Products     States shrimp makes her sick.  Other reaction(s): NAUSEA, VOMITING  . Victoza [Liraglutide] Diarrhea and Other (See Comments)    Problems with pancreas    No results found for this or any previous visit (from the past 2160 hour(s)).  Objective: General: Patient is awake, alert, and oriented x 3 and in no acute distress.  Integument: Skin is warm, dry and supple bilateral. Nails are tender, long, thickened and dystrophic with subungual debris, consistent with onychomycosis, 1-5 bilateral. No signs of infection. + minimal corn at bilateral 5th toes. Remaining integument unremarkable.  Vasculature:  Dorsalis Pedis pulse 1/4 bilateral. Posterior Tibial pulse  0/4 bilateral due to trace edema at ankles. Capillary fill time <3 sec 1-5 bilateral. Scant hair growth to the level of the digits.Temperature gradient within normal limits. No varicosities present bilateral, + hyperpigmentation bilateral.   Neurology: The patient has diminished sensation measured with a  5.07/10g Semmes Weinstein Monofilament at all pedal sites bilateral . Vibratory sensation diminished bilateral with tuning fork. No Babinski sign present bilateral.   Musculoskeletal: Mild hammetoe pedal deformities noted bilateral. Muscular strength 5/5 in all lower extremity muscular groups bilateral without pain on range of motion . No tenderness with calf compression bilateral.  Assessment and Plan: Problem List Items Addressed This Visit      Endocrine   Diabetic polyneuropathy associated with type 2 diabetes mellitus (HCC)    Other Visit Diagnoses    Pain due to onychomycosis of toenail    -  Primary   Corns and callosities       PVD (peripheral vascular disease) (HCC)       Foot pain, bilateral          -Examined patient. -Discussed and educated patient on diabetic foot care, especially with  regards to the vascular, neurological and musculoskeletal systems.  -Stressed the importance of good glycemic control and the detriment of not controlling glucose levels in relation to  the foot. -Mechanically smoothed corns at 5th toes using rotary bur and all nails 1-5 bilateral using sterile nail nipper and filed with dremel without incident  -Recommend daily skin emollients for dryness  -Continue with extra depth diabetic shoes for foot type -Recommend topical OTC pain rub and to discuss with her doctor who gives her pain medication for other treatments that will be safe to add on for neuropathy -Answered all patient questions -Patient to return in 3 months for at risk foot care -Patient advised to call the office if any problems or questions arise in the meantime.  Asencion Islam, DPM

## 2017-03-22 DIAGNOSIS — R9439 Abnormal result of other cardiovascular function study: Secondary | ICD-10-CM | POA: Insufficient documentation

## 2017-04-06 DIAGNOSIS — M47816 Spondylosis without myelopathy or radiculopathy, lumbar region: Secondary | ICD-10-CM | POA: Insufficient documentation

## 2017-04-27 ENCOUNTER — Ambulatory Visit: Payer: Medicare Other | Admitting: Sports Medicine

## 2017-05-05 ENCOUNTER — Ambulatory Visit: Payer: Medicare Other | Admitting: Sports Medicine

## 2017-05-12 ENCOUNTER — Ambulatory Visit (INDEPENDENT_AMBULATORY_CARE_PROVIDER_SITE_OTHER): Payer: Medicare Other | Admitting: Sports Medicine

## 2017-05-12 ENCOUNTER — Encounter: Payer: Self-pay | Admitting: Sports Medicine

## 2017-05-12 DIAGNOSIS — B351 Tinea unguium: Secondary | ICD-10-CM | POA: Diagnosis not present

## 2017-05-12 DIAGNOSIS — M79676 Pain in unspecified toe(s): Secondary | ICD-10-CM

## 2017-05-12 DIAGNOSIS — M79672 Pain in left foot: Secondary | ICD-10-CM

## 2017-05-12 DIAGNOSIS — E1142 Type 2 diabetes mellitus with diabetic polyneuropathy: Secondary | ICD-10-CM

## 2017-05-12 DIAGNOSIS — M79671 Pain in right foot: Secondary | ICD-10-CM

## 2017-05-12 DIAGNOSIS — L84 Corns and callosities: Secondary | ICD-10-CM

## 2017-05-12 DIAGNOSIS — I739 Peripheral vascular disease, unspecified: Secondary | ICD-10-CM

## 2017-05-12 NOTE — Progress Notes (Signed)
Patient ID: Madison Burnett, female   DOB: 11/18/1940, 77 y.o.   MRN: [id number]   Subjective: Madison Burnett is a 77 y.o. female patient with history of diabetes who returns to office today complaining of corn and long, painful nails  while ambulating in shoes; unable to trim. Patient states that the glucose reading this morning was not recorded but usually in the 150s.  Patient denies any new changes in medication or other new problems.  Admits to same worse neuropathy pain.  Saw PCP in November  Patient Active Problem List   Diagnosis Date Noted  . GERD (gastroesophageal reflux disease) 10/05/2016  . Spondylolisthesis at L4-L5 level 09/06/2016  . Iron deficiency anemia 07/14/2016  . Risk for falls 04/05/2016  . Atypical chest pain 12/01/2015  . Carpal tunnel syndrome, left 09/28/2015  . Primary insomnia 05/27/2015  . CKD (chronic kidney disease), stage III (HCC) 05/26/2015  . High risk medication use 05/26/2015  . Palpitations 05/26/2015  . Asthma 05/24/2015  . Chronic constipation 05/24/2015  . Chronic rhinitis 05/24/2015  . Coronary arteriosclerosis in native artery 05/24/2015  . Diabetic polyneuropathy associated with type 2 diabetes mellitus (HCC) 05/24/2015  . Diastolic congestive heart failure (HCC) 05/24/2015  . Edema 05/24/2015  . Gout 05/24/2015  . Hallux valgus 05/24/2015  . Hammer toe 05/24/2015  . Hemorrhoids 05/24/2015  . Hyperlipidemia 05/24/2015  . Hypokalemia 05/24/2015  . Hypothyroidism 05/24/2015  . Non morbid obesity due to excess calories 05/24/2015  . Obstructive sleep apnea 05/24/2015  . Onychomycosis 05/24/2015  . Osteoarthritis 05/24/2015  . Palpitation 05/24/2015  . Sarcoidosis 05/24/2015  . Tinea pedis of both feet 05/24/2015  . Vitamin D deficiency 05/24/2015  . Lumbar degenerative disc disease 02/07/2015  . Lumbar radiculopathy 02/07/2015  . Osteoarthritis of spine with radiculopathy, lumbar region 02/07/2015  . Acute right ankle pain 01/30/2015  .  Abnormal gait 11/13/2014  . Chronic diastolic congestive heart failure (HCC) 10/23/2014  . Coronary artery disease involving native coronary artery of native heart without angina pectoris 10/23/2014  . Dyslipidemia 10/23/2014  . Essential hypertension 10/23/2014  . Type 2 diabetes mellitus without complication (HCC) 10/23/2014  . Left hip pain 10/17/2014  . Preop examination 10/17/2014  . Degenerative joint disease of pelvic region 08/20/2014  . Primary osteoarthritis of left hip 08/20/2014  . Bilateral leg edema 11/05/2013   Current Outpatient Medications on File Prior to Visit  Medication Sig Dispense Refill  . albuterol (PROVENTIL HFA;VENTOLIN HFA) 108 (90 BASE) MCG/ACT inhaler Inhale into the lungs every 6 (six) hours as needed for wheezing or shortness of breath.    . allopurinol (ZYLOPRIM) 300 MG tablet Take 300 mg by mouth daily.    Marland Kitchen aspirin 81 MG tablet Take 81 mg by mouth daily.    Marland Kitchen azelastine (ASTELIN) 0.1 % nasal spray Place into both nostrils 2 (two) times daily. Use in each nostril as directed    . EPINEPHrine (EPIPEN) 0.3 mg/0.3 mL IJ SOAJ injection Inject into the muscle once.    . Fe Fum-FA-B Cmp-C-Zn-Mg-Mn-Cu (FERROCITE PLUS) 106-1 MG TABS Take by mouth.    Marland Kitchen FLUTICASONE PROPIONATE, INHAL, IN Inhale into the lungs.    . folic acid (FOLVITE) 1 MG tablet Take 1 mg by mouth daily.    Marland Kitchen HYDROcodone-acetaminophen (NORCO/VICODIN) 5-325 MG per tablet Take 1 tablet by mouth every 6 (six) hours as needed for moderate pain.    . Insulin Aspart (NOVOLOG FLEXPEN [place]) Inject into the skin.    Marland Kitchen insulin detemir (LEVEMIR) 100  UNIT/ML injection Inject 70 Units into the skin at bedtime.    . isosorbide mononitrate (IMDUR) 60 MG 24 hr tablet Take 60 mg by mouth daily.    Marland Kitchen levothyroxine (SYNTHROID, LEVOTHROID) 175 MCG tablet Take 175 mcg by mouth daily before breakfast.    . metoprolol succinate (TOPROL-XL) 100 MG 24 hr tablet Take 100 mg by mouth daily. Take with or immediately following  a meal.    . omeprazole (PRILOSEC) 40 MG capsule Take 40 mg by mouth daily.    . polyethylene glycol (MIRALAX / GLYCOLAX) packet Take 17 g by mouth daily.    . ranitidine (ZANTAC) 300 MG tablet Take 300 mg by mouth at bedtime.    . ranolazine (RANEXA) 500 MG 12 hr tablet Take 500 mg by mouth 2 (two) times daily.    . tizanidine (ZANAFLEX) 2 MG capsule Take 2 mg by mouth 3 (three) times daily.    Marland Kitchen torsemide (DEMADEX) 100 MG tablet Take 100 mg by mouth daily.    Marland Kitchen zolpidem (AMBIEN) 10 MG tablet Take 10 mg by mouth at bedtime as needed for sleep.     No current facility-administered medications on file prior to visit.    Allergies  Allergen Reactions  . Actos [Pioglitazone] Other (See Comments)    Bloating  . Marcelline Deist [Dapagliflozin] Nausea Only and Other (See Comments)    Bloating  . Metformin Nausea And Vomiting and Other (See Comments)    Bloating  . Metformin And Related   . Shellfish-Derived Products     States shrimp makes her sick.  Other reaction(s): NAUSEA, VOMITING  . Victoza [Liraglutide] Diarrhea and Other (See Comments)    Problems with pancreas    No results found for this or any previous visit (from the past 2160 hour(s)).  Objective: General: Patient is awake, alert, and oriented x 3 and in no acute distress.  Integument: Skin is warm, dry and supple bilateral. Nails are tender, long, thickened and dystrophic with subungual debris, consistent with onychomycosis, 1-5 bilateral. No signs of infection. + minimal corn at bilateral 5th toes. Remaining integument unremarkable.  Vasculature:  Dorsalis Pedis pulse 1/4 bilateral. Posterior Tibial pulse  0/4 bilateral due to trace edema at ankles. Capillary fill time <3 sec 1-5 bilateral. Scant hair growth to the level of the digits.Temperature gradient within normal limits. No varicosities present bilateral, + hyperpigmentation bilateral.   Neurology: The patient has diminished sensation measured with a 5.07/10g Semmes  Weinstein Monofilament at all pedal sites bilateral . Vibratory sensation diminished bilateral with tuning fork. No Babinski sign present bilateral.   Musculoskeletal: Mild hammetoe pedal deformities noted bilateral. Muscular strength 5/5 in all lower extremity muscular groups bilateral without pain on range of motion . No tenderness with calf compression bilateral.  Assessment and Plan: Problem List Items Addressed This Visit      Endocrine   Diabetic polyneuropathy associated with type 2 diabetes mellitus (HCC)    Other Visit Diagnoses    Pain due to onychomycosis of toenail    -  Primary   Corns and callosities       PVD (peripheral vascular disease) (HCC)       Foot pain, bilateral          -Examined patient. -Discussed and educated patient on diabetic foot care, especially with  regards to the vascular, neurological and musculoskeletal systems.  -Stressed the importance of good glycemic control and the detriment of not controlling glucose levels in relation to the foot. -Mechanically smoothed  corns at 5th toes using rotary bur and all nails 1-5 bilateral using sterile nail nipper and filed with dremel without incident  -Recommend daily skin emollients for dryness  -Continue with extra depth diabetic shoes for foot type -Recommend again topical OTC pain rub and to discuss with her doctor who gives her pain medication for other treatments that will be safe to add on for neuropathy -Answered all patient questions -Patient to return in 3 months for at risk foot care -Patient advised to call the office if any problems or questions arise in the meantime.  Asencion Islamitorya Amelia Macken, DPM

## 2017-05-24 DIAGNOSIS — R609 Edema, unspecified: Secondary | ICD-10-CM | POA: Insufficient documentation

## 2017-06-17 DIAGNOSIS — J9602 Acute respiratory failure with hypercapnia: Secondary | ICD-10-CM

## 2017-06-19 DIAGNOSIS — I4891 Unspecified atrial fibrillation: Secondary | ICD-10-CM | POA: Diagnosis not present

## 2017-06-19 DIAGNOSIS — I5033 Acute on chronic diastolic (congestive) heart failure: Secondary | ICD-10-CM

## 2017-06-20 DIAGNOSIS — I5033 Acute on chronic diastolic (congestive) heart failure: Secondary | ICD-10-CM | POA: Diagnosis not present

## 2017-06-20 DIAGNOSIS — I4891 Unspecified atrial fibrillation: Secondary | ICD-10-CM | POA: Diagnosis not present

## 2017-06-21 DIAGNOSIS — I4891 Unspecified atrial fibrillation: Secondary | ICD-10-CM | POA: Diagnosis not present

## 2017-06-21 DIAGNOSIS — R0602 Shortness of breath: Secondary | ICD-10-CM | POA: Diagnosis not present

## 2017-06-21 DIAGNOSIS — I5033 Acute on chronic diastolic (congestive) heart failure: Secondary | ICD-10-CM | POA: Diagnosis not present

## 2017-06-22 DIAGNOSIS — D72829 Elevated white blood cell count, unspecified: Secondary | ICD-10-CM

## 2017-06-22 DIAGNOSIS — I4891 Unspecified atrial fibrillation: Secondary | ICD-10-CM | POA: Diagnosis not present

## 2017-06-22 DIAGNOSIS — I5033 Acute on chronic diastolic (congestive) heart failure: Secondary | ICD-10-CM | POA: Diagnosis not present

## 2017-06-22 DIAGNOSIS — I471 Supraventricular tachycardia: Secondary | ICD-10-CM

## 2017-06-22 DIAGNOSIS — J40 Bronchitis, not specified as acute or chronic: Secondary | ICD-10-CM

## 2017-06-22 DIAGNOSIS — R0602 Shortness of breath: Secondary | ICD-10-CM | POA: Diagnosis not present

## 2017-06-23 DIAGNOSIS — I4891 Unspecified atrial fibrillation: Secondary | ICD-10-CM | POA: Diagnosis not present

## 2017-06-23 DIAGNOSIS — J40 Bronchitis, not specified as acute or chronic: Secondary | ICD-10-CM | POA: Diagnosis not present

## 2017-06-23 DIAGNOSIS — I5033 Acute on chronic diastolic (congestive) heart failure: Secondary | ICD-10-CM | POA: Diagnosis not present

## 2017-06-23 DIAGNOSIS — I471 Supraventricular tachycardia: Secondary | ICD-10-CM | POA: Diagnosis not present

## 2017-06-23 DIAGNOSIS — D72829 Elevated white blood cell count, unspecified: Secondary | ICD-10-CM | POA: Diagnosis not present

## 2017-06-23 DIAGNOSIS — I5042 Chronic combined systolic (congestive) and diastolic (congestive) heart failure: Secondary | ICD-10-CM

## 2017-06-24 DIAGNOSIS — I471 Supraventricular tachycardia: Secondary | ICD-10-CM

## 2017-06-24 DIAGNOSIS — I4891 Unspecified atrial fibrillation: Secondary | ICD-10-CM | POA: Diagnosis not present

## 2017-06-24 DIAGNOSIS — I5033 Acute on chronic diastolic (congestive) heart failure: Secondary | ICD-10-CM | POA: Diagnosis not present

## 2017-06-24 DIAGNOSIS — D72829 Elevated white blood cell count, unspecified: Secondary | ICD-10-CM | POA: Diagnosis not present

## 2017-06-24 DIAGNOSIS — J40 Bronchitis, not specified as acute or chronic: Secondary | ICD-10-CM | POA: Diagnosis not present

## 2017-07-05 DIAGNOSIS — I471 Supraventricular tachycardia: Secondary | ICD-10-CM | POA: Insufficient documentation

## 2017-07-05 DIAGNOSIS — I4891 Unspecified atrial fibrillation: Secondary | ICD-10-CM | POA: Insufficient documentation

## 2017-08-04 ENCOUNTER — Encounter: Payer: Self-pay | Admitting: Gastroenterology

## 2017-08-10 ENCOUNTER — Ambulatory Visit (INDEPENDENT_AMBULATORY_CARE_PROVIDER_SITE_OTHER): Payer: Medicare Other | Admitting: Sports Medicine

## 2017-08-10 ENCOUNTER — Encounter: Payer: Self-pay | Admitting: Sports Medicine

## 2017-08-10 DIAGNOSIS — M79672 Pain in left foot: Secondary | ICD-10-CM

## 2017-08-10 DIAGNOSIS — B351 Tinea unguium: Secondary | ICD-10-CM

## 2017-08-10 DIAGNOSIS — M79671 Pain in right foot: Secondary | ICD-10-CM

## 2017-08-10 DIAGNOSIS — E1142 Type 2 diabetes mellitus with diabetic polyneuropathy: Secondary | ICD-10-CM

## 2017-08-10 DIAGNOSIS — I739 Peripheral vascular disease, unspecified: Secondary | ICD-10-CM

## 2017-08-10 DIAGNOSIS — M79676 Pain in unspecified toe(s): Secondary | ICD-10-CM

## 2017-08-10 DIAGNOSIS — L84 Corns and callosities: Secondary | ICD-10-CM

## 2017-08-10 NOTE — Progress Notes (Signed)
Patient ID: Madison Burnett, female   DOB: 09-27-1940, 77 y.o.   MRN: [id number]   Subjective: Madison Burnett is a 77 y.o. female patient with history of diabetes who returns to office today complaining of corn and long, painful nails  while ambulating in shoes; unable to trim. Patient states that the glucose reading this morning was 170.  Patient states that she was in hospital in Feb for asthma COPD and ended up having heart failure and having to go to the ICU.  Since patient has been seeing her cardiologist and they are thinking about placing a pacemaker..  Admits to same worse neuropathy pain and swelling in the legs.  Denies any other issues at this time.  Saw PCP 2 months ago  Patient Active Problem List   Diagnosis Date Noted  . GERD (gastroesophageal reflux disease) 10/05/2016  . Spondylolisthesis at L4-L5 level 09/06/2016  . Iron deficiency anemia 07/14/2016  . Risk for falls 04/05/2016  . Atypical chest pain 12/01/2015  . Carpal tunnel syndrome, left 09/28/2015  . Primary insomnia 05/27/2015  . CKD (chronic kidney disease), stage III (HCC) 05/26/2015  . High risk medication use 05/26/2015  . Palpitations 05/26/2015  . Asthma 05/24/2015  . Chronic constipation 05/24/2015  . Chronic rhinitis 05/24/2015  . Coronary arteriosclerosis in native artery 05/24/2015  . Diabetic polyneuropathy associated with type 2 diabetes mellitus (HCC) 05/24/2015  . Diastolic congestive heart failure (HCC) 05/24/2015  . Edema 05/24/2015  . Gout 05/24/2015  . Hallux valgus 05/24/2015  . Hammer toe 05/24/2015  . Hemorrhoids 05/24/2015  . Hyperlipidemia 05/24/2015  . Hypokalemia 05/24/2015  . Hypothyroidism 05/24/2015  . Non morbid obesity due to excess calories 05/24/2015  . Obstructive sleep apnea 05/24/2015  . Onychomycosis 05/24/2015  . Osteoarthritis 05/24/2015  . Palpitation 05/24/2015  . Sarcoidosis 05/24/2015  . Tinea pedis of both feet 05/24/2015  . Vitamin D deficiency 05/24/2015  . Lumbar  degenerative disc disease 02/07/2015  . Lumbar radiculopathy 02/07/2015  . Osteoarthritis of spine with radiculopathy, lumbar region 02/07/2015  . Acute right ankle pain 01/30/2015  . Abnormal gait 11/13/2014  . Chronic diastolic congestive heart failure (HCC) 10/23/2014  . Coronary artery disease involving native coronary artery of native heart without angina pectoris 10/23/2014  . Dyslipidemia 10/23/2014  . Essential hypertension 10/23/2014  . Type 2 diabetes mellitus without complication (HCC) 10/23/2014  . Left hip pain 10/17/2014  . Preop examination 10/17/2014  . Degenerative joint disease of pelvic region 08/20/2014  . Primary osteoarthritis of left hip 08/20/2014  . Bilateral leg edema 11/05/2013   Current Outpatient Medications on File Prior to Visit  Medication Sig Dispense Refill  . albuterol (PROVENTIL HFA;VENTOLIN HFA) 108 (90 BASE) MCG/ACT inhaler Inhale into the lungs every 6 (six) hours as needed for wheezing or shortness of breath.    . allopurinol (ZYLOPRIM) 300 MG tablet Take 300 mg by mouth daily.    Marland Kitchen aspirin 81 MG tablet Take 81 mg by mouth daily.    Marland Kitchen azelastine (ASTELIN) 0.1 % nasal spray Place into both nostrils 2 (two) times daily. Use in each nostril as directed    . EPINEPHrine (EPIPEN) 0.3 mg/0.3 mL IJ SOAJ injection Inject into the muscle once.    . Fe Fum-FA-B Cmp-C-Zn-Mg-Mn-Cu (FERROCITE PLUS) 106-1 MG TABS Take by mouth.    Marland Kitchen FLUTICASONE PROPIONATE, INHAL, IN Inhale into the lungs.    . folic acid (FOLVITE) 1 MG tablet Take 1 mg by mouth daily.    Marland Kitchen HYDROcodone-acetaminophen (NORCO/VICODIN) 5-325  MG per tablet Take 1 tablet by mouth every 6 (six) hours as needed for moderate pain.    . Insulin Aspart (NOVOLOG FLEXPEN [place]) Inject into the skin.    Marland Kitchen. insulin detemir (LEVEMIR) 100 UNIT/ML injection Inject 70 Units into the skin at bedtime.    . isosorbide mononitrate (IMDUR) 60 MG 24 hr tablet Take 60 mg by mouth daily.    Marland Kitchen. levothyroxine (SYNTHROID,  LEVOTHROID) 175 MCG tablet Take 175 mcg by mouth daily before breakfast.    . metoprolol succinate (TOPROL-XL) 100 MG 24 hr tablet Take 100 mg by mouth daily. Take with or immediately following a meal.    . omeprazole (PRILOSEC) 40 MG capsule Take 40 mg by mouth daily.    . polyethylene glycol (MIRALAX / GLYCOLAX) packet Take 17 g by mouth daily.    . ranitidine (ZANTAC) 300 MG tablet Take 300 mg by mouth at bedtime.    . ranolazine (RANEXA) 500 MG 12 hr tablet Take 500 mg by mouth 2 (two) times daily.    . tizanidine (ZANAFLEX) 2 MG capsule Take 2 mg by mouth 3 (three) times daily.    Marland Kitchen. torsemide (DEMADEX) 100 MG tablet Take 100 mg by mouth daily.    Marland Kitchen. zolpidem (AMBIEN) 10 MG tablet Take 10 mg by mouth at bedtime as needed for sleep.     No current facility-administered medications on file prior to visit.    Allergies  Allergen Reactions  . Actos [Pioglitazone] Other (See Comments)    Bloating  . Marcelline DeistFarxiga [Dapagliflozin] Nausea Only and Other (See Comments)    Bloating  . Metformin Nausea And Vomiting and Other (See Comments)    Bloating  . Metformin And Related   . Shellfish-Derived Products     States shrimp makes her sick.  Other reaction(s): NAUSEA, VOMITING  . Victoza [Liraglutide] Diarrhea and Other (See Comments)    Problems with pancreas    No results found for this or any previous visit (from the past 2160 hour(s)).  Objective: General: Patient is awake, alert, and oriented x 3 and in no acute distress.  Integument: Skin is warm, dry and supple bilateral. Nails are tender, long, thickened and dystrophic with subungual debris, consistent with onychomycosis, 1-5 bilateral. No signs of infection. + minimal corn at bilateral 5th toes. Remaining integument unremarkable.  Vasculature:  Dorsalis Pedis pulse 1/4 bilateral. Posterior Tibial pulse  0/4 bilateral due to trace edema at ankles. Capillary fill time <3 sec 1-5 bilateral. Scant hair growth to the level of the  digits.Temperature gradient within normal limits. No varicosities present bilateral, + hyperpigmentation bilateral.   Neurology: The patient has diminished sensation measured with a 5.07/10g Semmes Weinstein Monofilament at all pedal sites bilateral . Vibratory sensation diminished bilateral with tuning fork. No Babinski sign present bilateral.   Musculoskeletal: Mild hammetoe pedal deformities noted bilateral. Muscular strength 5/5 in all lower extremity muscular groups bilateral without pain on range of motion . No tenderness with calf compression bilateral.  Assessment and Plan: Problem List Items Addressed This Visit      Endocrine   Diabetic polyneuropathy associated with type 2 diabetes mellitus (HCC)    Other Visit Diagnoses    Pain due to onychomycosis of toenail    -  Primary   Corns and callosities       PVD (peripheral vascular disease) (HCC)       Foot pain, bilateral         -Examined patient. -Discussed and educated patient on  diabetic foot care, especially with  regards to the vascular, neurological and musculoskeletal systems.  -Stressed the importance of good glycemic control and the detriment of not controlling glucose levels in relation to the foot. -Mechanically smoothed corns at 5th toes using rotary bur and mechanically debrided all nails 1-5 bilateral using sterile nail nipper and filed with dremel without incident  -Recommend elevation of legs to assist with edema control and to watch her diet -Answered all patient questions -Patient to return in 3 months for at risk foot care -Patient advised to call the office if any problems or questions arise in the meantime.  Asencion Islam, DPM

## 2017-08-11 ENCOUNTER — Ambulatory Visit: Payer: Medicare Other | Admitting: Sports Medicine

## 2017-08-16 DIAGNOSIS — I495 Sick sinus syndrome: Secondary | ICD-10-CM | POA: Insufficient documentation

## 2017-09-13 DIAGNOSIS — Z950 Presence of cardiac pacemaker: Secondary | ICD-10-CM | POA: Insufficient documentation

## 2017-10-01 DIAGNOSIS — E872 Acidosis: Secondary | ICD-10-CM | POA: Diagnosis not present

## 2017-10-01 DIAGNOSIS — R42 Dizziness and giddiness: Secondary | ICD-10-CM

## 2017-10-01 DIAGNOSIS — N179 Acute kidney failure, unspecified: Secondary | ICD-10-CM | POA: Diagnosis not present

## 2017-10-01 DIAGNOSIS — E860 Dehydration: Secondary | ICD-10-CM

## 2017-10-01 DIAGNOSIS — W19XXXA Unspecified fall, initial encounter: Secondary | ICD-10-CM | POA: Diagnosis not present

## 2017-10-02 DIAGNOSIS — E872 Acidosis: Secondary | ICD-10-CM | POA: Diagnosis not present

## 2017-10-02 DIAGNOSIS — E860 Dehydration: Secondary | ICD-10-CM | POA: Diagnosis not present

## 2017-10-02 DIAGNOSIS — R42 Dizziness and giddiness: Secondary | ICD-10-CM | POA: Diagnosis not present

## 2017-10-02 DIAGNOSIS — W19XXXA Unspecified fall, initial encounter: Secondary | ICD-10-CM | POA: Diagnosis not present

## 2017-10-02 DIAGNOSIS — N179 Acute kidney failure, unspecified: Secondary | ICD-10-CM | POA: Diagnosis not present

## 2017-11-02 ENCOUNTER — Encounter: Payer: Self-pay | Admitting: Sports Medicine

## 2017-11-02 ENCOUNTER — Ambulatory Visit (INDEPENDENT_AMBULATORY_CARE_PROVIDER_SITE_OTHER): Payer: Medicare Other | Admitting: Sports Medicine

## 2017-11-02 DIAGNOSIS — M79671 Pain in right foot: Secondary | ICD-10-CM | POA: Diagnosis not present

## 2017-11-02 DIAGNOSIS — M79676 Pain in unspecified toe(s): Secondary | ICD-10-CM

## 2017-11-02 DIAGNOSIS — B351 Tinea unguium: Secondary | ICD-10-CM

## 2017-11-02 DIAGNOSIS — M79672 Pain in left foot: Secondary | ICD-10-CM | POA: Diagnosis not present

## 2017-11-02 DIAGNOSIS — E1142 Type 2 diabetes mellitus with diabetic polyneuropathy: Secondary | ICD-10-CM

## 2017-11-02 DIAGNOSIS — I739 Peripheral vascular disease, unspecified: Secondary | ICD-10-CM

## 2017-11-02 DIAGNOSIS — L84 Corns and callosities: Secondary | ICD-10-CM

## 2017-11-02 NOTE — Progress Notes (Signed)
Patient ID: Madison Burnett, female   DOB: [birth year], 77 y.o.   MRN: [id number]   Subjective: Madison Burnett is a 77 y.o. female patient with history of diabetes who returns to office today complaining of corn and long, painful nails  while ambulating in shoes; unable to trim. Patient states that the glucose reading this morning was 232.  Patient states that she fell on June 8th but now is doing better, denies LOC. Saw PCP in May and  admits to same worse neuropathy pain and swelling in the legs and dry skin.  Denies any other issues at this time.  Patient Active Problem List   Diagnosis Date Noted  . GERD (gastroesophageal reflux disease) 10/05/2016  . Spondylolisthesis at L4-L5 level 09/06/2016  . Iron deficiency anemia 07/14/2016  . Risk for falls 04/05/2016  . Atypical chest pain 12/01/2015  . Carpal tunnel syndrome, left 09/28/2015  . Primary insomnia 05/27/2015  . CKD (chronic kidney disease), stage III (HCC) 05/26/2015  . High risk medication use 05/26/2015  . Palpitations 05/26/2015  . Asthma 05/24/2015  . Chronic constipation 05/24/2015  . Chronic rhinitis 05/24/2015  . Coronary arteriosclerosis in native artery 05/24/2015  . Diabetic polyneuropathy associated with type 2 diabetes mellitus (HCC) 05/24/2015  . Diastolic congestive heart failure (HCC) 05/24/2015  . Edema 05/24/2015  . Gout 05/24/2015  . Hallux valgus 05/24/2015  . Hammer toe 05/24/2015  . Hemorrhoids 05/24/2015  . Hyperlipidemia 05/24/2015  . Hypokalemia 05/24/2015  . Hypothyroidism 05/24/2015  . Non morbid obesity due to excess calories 05/24/2015  . Obstructive sleep apnea 05/24/2015  . Onychomycosis 05/24/2015  . Osteoarthritis 05/24/2015  . Palpitation 05/24/2015  . Sarcoidosis 05/24/2015  . Tinea pedis of both feet 05/24/2015  . Vitamin D deficiency 05/24/2015  . Lumbar degenerative disc disease 02/07/2015  . Lumbar radiculopathy 02/07/2015  . Osteoarthritis of spine with radiculopathy, lumbar region  02/07/2015  . Acute right ankle pain 01/30/2015  . Abnormal gait 11/13/2014  . Chronic diastolic congestive heart failure (HCC) 10/23/2014  . Coronary artery disease involving native coronary artery of native heart without angina pectoris 10/23/2014  . Dyslipidemia 10/23/2014  . Essential hypertension 10/23/2014  . Type 2 diabetes mellitus without complication (HCC) 10/23/2014  . Left hip pain 10/17/2014  . Preop examination 10/17/2014  . Degenerative joint disease of pelvic region 08/20/2014  . Primary osteoarthritis of left hip 08/20/2014  . Bilateral leg edema 11/05/2013   Current Outpatient Medications on File Prior to Visit  Medication Sig Dispense Refill  . albuterol (PROVENTIL HFA;VENTOLIN HFA) 108 (90 BASE) MCG/ACT inhaler Inhale into the lungs every 6 (six) hours as needed for wheezing or shortness of breath.    . allopurinol (ZYLOPRIM) 300 MG tablet Take 300 mg by mouth daily.    Marland Kitchen aspirin 81 MG tablet Take 81 mg by mouth daily.    Marland Kitchen azelastine (ASTELIN) 0.1 % nasal spray Place into both nostrils 2 (two) times daily. Use in each nostril as directed    . EPINEPHrine (EPIPEN) 0.3 mg/0.3 mL IJ SOAJ injection Inject into the muscle once.    . Fe Fum-FA-B Cmp-C-Zn-Mg-Mn-Cu (FERROCITE PLUS) 106-1 MG TABS Take by mouth.    Marland Kitchen FLUTICASONE PROPIONATE, INHAL, IN Inhale into the lungs.    . folic acid (FOLVITE) 1 MG tablet Take 1 mg by mouth daily.    Marland Kitchen HYDROcodone-acetaminophen (NORCO/VICODIN) 5-325 MG per tablet Take 1 tablet by mouth every 6 (six) hours as needed for moderate pain.    . Insulin Aspart (NOVOLOG  FLEXPEN [place]) Inject into the skin.    Marland Kitchen insulin detemir (LEVEMIR) 100 UNIT/ML injection Inject 70 Units into the skin at bedtime.    . isosorbide mononitrate (IMDUR) 60 MG 24 hr tablet Take 60 mg by mouth daily.    Marland Kitchen levothyroxine (SYNTHROID, LEVOTHROID) 175 MCG tablet Take 175 mcg by mouth daily before breakfast.    . metoprolol succinate (TOPROL-XL) 100 MG 24 hr tablet Take 100  mg by mouth daily. Take with or immediately following a meal.    . omeprazole (PRILOSEC) 40 MG capsule Take 40 mg by mouth daily.    . polyethylene glycol (MIRALAX / GLYCOLAX) packet Take 17 g by mouth daily.    . ranitidine (ZANTAC) 300 MG tablet Take 300 mg by mouth at bedtime.    . ranolazine (RANEXA) 500 MG 12 hr tablet Take 500 mg by mouth 2 (two) times daily.    . tizanidine (ZANAFLEX) 2 MG capsule Take 2 mg by mouth 3 (three) times daily.    Marland Kitchen torsemide (DEMADEX) 100 MG tablet Take 100 mg by mouth daily.    Marland Kitchen zolpidem (AMBIEN) 10 MG tablet Take 10 mg by mouth at bedtime as needed for sleep.     No current facility-administered medications on file prior to visit.    Allergies  Allergen Reactions  . Actos [Pioglitazone] Other (See Comments)    Bloating  . Marcelline Deist [Dapagliflozin] Nausea Only and Other (See Comments)    Bloating  . Metformin Nausea And Vomiting and Other (See Comments)    Bloating  . Metformin And Related   . Shellfish-Derived Products     States shrimp makes her sick.  Other reaction(s): NAUSEA, VOMITING  . Victoza [Liraglutide] Diarrhea and Other (See Comments)    Problems with pancreas    No results found for this or any previous visit (from the past 2160 hour(s)).  Objective: General: Patient is awake, alert, and oriented x 3 and in no acute distress.  Integument: Skin is warm, dry and supple bilateral. Nails are tender, long, thickened and dystrophic with subungual debris, consistent with onychomycosis, 1-5 bilateral. No signs of infection. + minimal corn at bilateral 5th toes. Remaining integument unremarkable.  Vasculature:  Dorsalis Pedis pulse 1/4 bilateral. Posterior Tibial pulse  0/4 bilateral due to trace edema at ankles. Capillary fill time <3 sec 1-5 bilateral. Scant hair growth to the level of the digits.Temperature gradient within normal limits. No varicosities present bilateral, + hyperpigmentation bilateral.   Neurology: The patient has  diminished sensation measured with a 5.07/10g Semmes Weinstein Monofilament at all pedal sites bilateral . Vibratory sensation diminished bilateral with tuning fork. No Babinski sign present bilateral.   Musculoskeletal: Mild hammetoe pedal deformities noted bilateral. Muscular strength 5/5 in all lower extremity muscular groups bilateral without pain on range of motion . No tenderness with calf compression bilateral.  Assessment and Plan: Problem List Items Addressed This Visit      Endocrine   Diabetic polyneuropathy associated with type 2 diabetes mellitus (HCC)    Other Visit Diagnoses    Pain due to onychomycosis of toenail    -  Primary   Corns and callosities       PVD (peripheral vascular disease) (HCC)       Foot pain, bilateral         -Examined patient. -Discussed and educated patient on diabetic foot care, especially with  regards to the vascular, neurological and musculoskeletal systems.  -Stressed the importance of good glycemic control and the  detriment of not controlling glucose levels in relation to the foot. -Mechanically smoothed corns at 5th toes using rotary bur and mechanically debrided all nails 1-5 bilateral using sterile nail nipper and filed with dremel without incident  -Recommend elevation of legs to assist with edema control and encouraged daily skin emollients -Advised patient to continue with recs from PCP for neuropathy even though she tried Gabapentin in past with no relief -Continue with cane for stability in gait -Answered all patient questions -Patient to return in 3 months for at risk foot care -Patient advised to call the office if any problems or questions arise in the meantime.  Asencion Islamitorya Daiveon Markman, DPM

## 2017-11-27 DIAGNOSIS — E871 Hypo-osmolality and hyponatremia: Secondary | ICD-10-CM | POA: Insufficient documentation

## 2017-12-19 DIAGNOSIS — R21 Rash and other nonspecific skin eruption: Secondary | ICD-10-CM | POA: Insufficient documentation

## 2018-01-22 DIAGNOSIS — N393 Stress incontinence (female) (male): Secondary | ICD-10-CM | POA: Insufficient documentation

## 2018-02-01 ENCOUNTER — Encounter: Payer: Self-pay | Admitting: Sports Medicine

## 2018-02-01 ENCOUNTER — Other Ambulatory Visit: Payer: Self-pay | Admitting: Sports Medicine

## 2018-02-01 ENCOUNTER — Ambulatory Visit (INDEPENDENT_AMBULATORY_CARE_PROVIDER_SITE_OTHER): Payer: Medicare Other

## 2018-02-01 ENCOUNTER — Ambulatory Visit (INDEPENDENT_AMBULATORY_CARE_PROVIDER_SITE_OTHER): Payer: Medicare Other | Admitting: Sports Medicine

## 2018-02-01 ENCOUNTER — Other Ambulatory Visit: Payer: Self-pay

## 2018-02-01 VITALS — BP 135/72 | HR 66 | Resp 16

## 2018-02-01 DIAGNOSIS — M79671 Pain in right foot: Secondary | ICD-10-CM

## 2018-02-01 DIAGNOSIS — R011 Cardiac murmur, unspecified: Secondary | ICD-10-CM | POA: Insufficient documentation

## 2018-02-01 DIAGNOSIS — N183 Chronic kidney disease, stage 3 (moderate): Secondary | ICD-10-CM

## 2018-02-01 DIAGNOSIS — M2042 Other hammer toe(s) (acquired), left foot: Secondary | ICD-10-CM

## 2018-02-01 DIAGNOSIS — E1142 Type 2 diabetes mellitus with diabetic polyneuropathy: Secondary | ICD-10-CM

## 2018-02-01 DIAGNOSIS — I739 Peripheral vascular disease, unspecified: Secondary | ICD-10-CM

## 2018-02-01 DIAGNOSIS — M79672 Pain in left foot: Principal | ICD-10-CM

## 2018-02-01 DIAGNOSIS — M79676 Pain in unspecified toe(s): Secondary | ICD-10-CM

## 2018-02-01 DIAGNOSIS — R32 Unspecified urinary incontinence: Secondary | ICD-10-CM | POA: Insufficient documentation

## 2018-02-01 DIAGNOSIS — M2041 Other hammer toe(s) (acquired), right foot: Secondary | ICD-10-CM

## 2018-02-01 DIAGNOSIS — M19079 Primary osteoarthritis, unspecified ankle and foot: Secondary | ICD-10-CM

## 2018-02-01 DIAGNOSIS — B351 Tinea unguium: Secondary | ICD-10-CM

## 2018-02-01 DIAGNOSIS — E1122 Type 2 diabetes mellitus with diabetic chronic kidney disease: Secondary | ICD-10-CM | POA: Insufficient documentation

## 2018-02-01 DIAGNOSIS — R0602 Shortness of breath: Secondary | ICD-10-CM | POA: Insufficient documentation

## 2018-02-01 NOTE — Progress Notes (Signed)
Patient ID: Madison Burnett, female   DOB: [birth year], 77 y.o.   MRN: [id number]   Subjective: Madison Burnett is a 77 y.o. female patient with history of diabetes who returns to office today complaining of long, painful nails while ambulating in shoes; unable to trim. Patient states that the glucose reading this morning was 131, A1c unknown, PCP Dr. Shary Decamp x 67month ago. Admits to pain in legs and feet that is burning and numbness seems like it is getting worse and patient requests x-ray today for further evaluation.  Denies any other issues at this time.  Patient Active Problem List   Diagnosis Date Noted  . GERD (gastroesophageal reflux disease) 10/05/2016  . Spondylolisthesis at L4-L5 level 09/06/2016  . Iron deficiency anemia 07/14/2016  . Risk for falls 04/05/2016  . Atypical chest pain 12/01/2015  . Carpal tunnel syndrome, left 09/28/2015  . Primary insomnia 05/27/2015  . CKD (chronic kidney disease), stage III (HCC) 05/26/2015  . High risk medication use 05/26/2015  . Palpitations 05/26/2015  . Asthma 05/24/2015  . Chronic constipation 05/24/2015  . Chronic rhinitis 05/24/2015  . Coronary arteriosclerosis in native artery 05/24/2015  . Diabetic polyneuropathy associated with type 2 diabetes mellitus (HCC) 05/24/2015  . Diastolic congestive heart failure (HCC) 05/24/2015  . Edema 05/24/2015  . Gout 05/24/2015  . Hallux valgus 05/24/2015  . Hammer toe 05/24/2015  . Hemorrhoids 05/24/2015  . Hyperlipidemia 05/24/2015  . Hypokalemia 05/24/2015  . Hypothyroidism 05/24/2015  . Non morbid obesity due to excess calories 05/24/2015  . Obstructive sleep apnea 05/24/2015  . Onychomycosis 05/24/2015  . Osteoarthritis 05/24/2015  . Palpitation 05/24/2015  . Sarcoidosis 05/24/2015  . Tinea pedis of both feet 05/24/2015  . Vitamin D deficiency 05/24/2015  . Lumbar degenerative disc disease 02/07/2015  . Lumbar radiculopathy 02/07/2015  . Osteoarthritis of spine with radiculopathy, lumbar  region 02/07/2015  . Acute right ankle pain 01/30/2015  . Abnormal gait 11/13/2014  . Chronic diastolic congestive heart failure (HCC) 10/23/2014  . Coronary artery disease involving native coronary artery of native heart without angina pectoris 10/23/2014  . Dyslipidemia 10/23/2014  . Essential hypertension 10/23/2014  . Type 2 diabetes mellitus without complication (HCC) 10/23/2014  . Left hip pain 10/17/2014  . Preop examination 10/17/2014  . Degenerative joint disease of pelvic region 08/20/2014  . Primary osteoarthritis of left hip 08/20/2014  . Bilateral leg edema 11/05/2013   Current Outpatient Medications on File Prior to Visit  Medication Sig Dispense Refill  . albuterol (PROVENTIL HFA;VENTOLIN HFA) 108 (90 BASE) MCG/ACT inhaler Inhale into the lungs every 6 (six) hours as needed for wheezing or shortness of breath.    . allopurinol (ZYLOPRIM) 300 MG tablet Take 300 mg by mouth daily.    Marland Kitchen aspirin 81 MG tablet Take 81 mg by mouth daily.    Marland Kitchen azelastine (ASTELIN) 0.1 % nasal spray Place into both nostrils 2 (two) times daily. Use in each nostril as directed    . EPINEPHrine (EPIPEN) 0.3 mg/0.3 mL IJ SOAJ injection Inject into the muscle once.    . Fe Fum-FA-B Cmp-C-Zn-Mg-Mn-Cu (FERROCITE PLUS) 106-1 MG TABS Take by mouth.    Marland Kitchen FLUTICASONE PROPIONATE, INHAL, IN Inhale into the lungs.    . folic acid (FOLVITE) 1 MG tablet Take 1 mg by mouth daily.    Marland Kitchen HYDROcodone-acetaminophen (NORCO/VICODIN) 5-325 MG per tablet Take 1 tablet by mouth every 6 (six) hours as needed for moderate pain.    . Insulin Aspart (NOVOLOG FLEXPEN [place]) Inject into the  skin.    . insulin detemir (LEVEMIR) 100 UNIT/ML injection Inject 70 Units into the skin at bedtime.    . isosorbide mononitrate (IMDUR) 60 MG 24 hr tablet Take 60 mg by mouth daily.    Marland Kitchen levothyroxine (SYNTHROID, LEVOTHROID) 175 MCG tablet Take 175 mcg by mouth daily before breakfast.    . metoprolol succinate (TOPROL-XL) 100 MG 24 hr tablet  Take 100 mg by mouth daily. Take with or immediately following a meal.    . omeprazole (PRILOSEC) 40 MG capsule Take 40 mg by mouth daily.    . polyethylene glycol (MIRALAX / GLYCOLAX) packet Take 17 g by mouth daily.    . ranitidine (ZANTAC) 300 MG tablet Take 300 mg by mouth at bedtime.    . ranolazine (RANEXA) 500 MG 12 hr tablet Take 500 mg by mouth 2 (two) times daily.    . tizanidine (ZANAFLEX) 2 MG capsule Take 2 mg by mouth 3 (three) times daily.    Marland Kitchen torsemide (DEMADEX) 100 MG tablet Take 100 mg by mouth daily.    Marland Kitchen zolpidem (AMBIEN) 10 MG tablet Take 10 mg by mouth at bedtime as needed for sleep.     No current facility-administered medications on file prior to visit.    Allergies  Allergen Reactions  . Shellfish Allergy Nausea Only    States shrimp makes her sick.  Other reaction(s): NAUSEA, VOMITING  . Marcelline Deist [Dapagliflozin] Nausea Only and Other (See Comments)    Bloating  . Metformin Nausea And Vomiting and Other (See Comments)    Bloating Stomach pain  . Metformin And Related   . Pioglitazone Other (See Comments)    Bloating Unknown  . Shellfish-Derived Products     States shrimp makes her sick.  Other reaction(s): NAUSEA, VOMITING  . Victoza [Liraglutide] Diarrhea and Other (See Comments)    Problems with pancreas    No results found for this or any previous visit (from the past 2160 hour(s)).  Objective: General: Patient is awake, alert, and oriented x 3 and in no acute distress.  Integument: Skin is warm, dry and supple bilateral. Nails are tender, long, thickened and dystrophic with subungual debris, consistent with onychomycosis, 1-5 bilateral. No signs of infection. + minimal corn at bilateral 5th toes. Remaining integument unremarkable.  Vasculature:  Dorsalis Pedis pulse 1/4 bilateral. Posterior Tibial pulse  0/4 bilateral due to trace edema at ankles. Capillary fill time <3 sec 1-5 bilateral. Scant hair growth to the level of the digits.Temperature  gradient within normal limits. No varicosities present bilateral, + hyperpigmentation bilateral.   Neurology: The patient has diminished sensation measured with a 5.07/10g Semmes Weinstein Monofilament at all pedal sites bilateral . Vibratory sensation diminished bilateral with tuning fork. No Babinski sign present bilateral.   Musculoskeletal: Subjective pain to both feet and legs to the level of the knees.  Pes planus and mild hammetoe pedal deformities noted bilateral. Muscular strength 5/5 in all lower extremity muscular groups bilateral without pain on range of motion . No tenderness with calf compression bilateral.  Assessment and Plan: Problem List Items Addressed This Visit      Endocrine   Diabetic polyneuropathy associated with type 2 diabetes mellitus (HCC)   Relevant Orders   DG Foot 2 Views Right   DG Foot 2 Views Left    Other Visit Diagnoses    Bilateral foot pain    -  Primary   Relevant Orders   DG Foot 2 Views Right   DG Foot 2  Views Left   PVD (peripheral vascular disease) (HCC)       Relevant Orders   DG Foot 2 Views Right   DG Foot 2 Views Left   Hammertoes of both feet       Pain due to onychomycosis of toenail         -Examined patient. -Discussed and educated patient on diabetic foot care, especially with  regards to the vascular, neurological and musculoskeletal systems.  -X-rays obtained and reviewed with patient revealing arthritis pes planus deformity hammertoe deformity no other acute findings advised patient that her pain is likely secondary to her neuropathy since her pain is to the level of the knees and consistent of numbness and tingling more so than anything structural related advised patient that also because of her history of PVD and swelling in her legs that this could also add to fatigue and pain with walking and standing -Mechanically smoothed corns at 5th toes using rotary bur and mechanically debrided all nails 1-5 bilateral using sterile nail  nipper and filed with dremel without incident  -Recommend elevation of legs to assist with edema control and encouraged daily skin emollients -Advised patient to continue with recs from PCP for neuropathy  -Continue with cane for stability in gait -Answered all patient questions -Patient to return in 3 months for at risk foot care -Patient advised to call the office if any problems or questions arise in the meantime.  Asencion Islam, DPM

## 2018-05-01 ENCOUNTER — Telehealth: Payer: Self-pay | Admitting: Sports Medicine

## 2018-05-01 NOTE — Telephone Encounter (Signed)
Pt is confusing our practice with the ortho office.  Her appointment has already been rescheduled

## 2018-05-01 NOTE — Telephone Encounter (Signed)
I'm calling about an appointment.

## 2018-05-01 NOTE — Telephone Encounter (Signed)
This is Glass blower/designer at [phone].

## 2018-05-03 ENCOUNTER — Ambulatory Visit: Payer: Medicare Other | Admitting: Sports Medicine

## 2018-05-11 ENCOUNTER — Ambulatory Visit (INDEPENDENT_AMBULATORY_CARE_PROVIDER_SITE_OTHER): Payer: Medicare Other | Admitting: Sports Medicine

## 2018-05-11 ENCOUNTER — Encounter: Payer: Self-pay | Admitting: Sports Medicine

## 2018-05-11 VITALS — BP 106/59 | HR 83 | Resp 16

## 2018-05-11 DIAGNOSIS — L84 Corns and callosities: Secondary | ICD-10-CM

## 2018-05-11 DIAGNOSIS — M19079 Primary osteoarthritis, unspecified ankle and foot: Secondary | ICD-10-CM

## 2018-05-11 DIAGNOSIS — I739 Peripheral vascular disease, unspecified: Secondary | ICD-10-CM

## 2018-05-11 DIAGNOSIS — M2042 Other hammer toe(s) (acquired), left foot: Secondary | ICD-10-CM

## 2018-05-11 DIAGNOSIS — E1142 Type 2 diabetes mellitus with diabetic polyneuropathy: Secondary | ICD-10-CM

## 2018-05-11 DIAGNOSIS — M79676 Pain in unspecified toe(s): Secondary | ICD-10-CM | POA: Diagnosis not present

## 2018-05-11 DIAGNOSIS — B351 Tinea unguium: Secondary | ICD-10-CM | POA: Diagnosis not present

## 2018-05-11 DIAGNOSIS — M2041 Other hammer toe(s) (acquired), right foot: Secondary | ICD-10-CM

## 2018-05-11 NOTE — Progress Notes (Signed)
Patient ID: Madison Burnett Cato, female   DOB: 10/01/1940, 78 y.o.   MRN: [id number]   Subjective: Madison Burnett Veldman is a 78 y.o. female patient with history of diabetes who returns to office today complaining of long, painful nails while ambulating in shoes; unable to trim. Patient states that the glucose reading this morning was 91, A1c unknown, PCP Dr. Shary DecampGrisso x 71month ago. Admits to continued pain in legs and feet that is burning and numbness seems like it is getting worse from her neuropathy.  Denies any other issues at this time.  Patient Active Problem List   Diagnosis Date Noted  . Diabetes mellitus with stage 3 chronic kidney disease (HCC) 02/01/2018  . Heart murmur 02/01/2018  . Short of breath on exertion 02/01/2018  . Urinary incontinence 02/01/2018  . Stress incontinence of urine 01/22/2018  . Rash 12/19/2017  . Hyponatremia 07/23/2017  . Pacemaker 09/13/2017  . SSS (sick sinus syndrome) (HCC) 08/16/2017  . Atrial fibrillation (HCC) 07/05/2017  . SVT (supraventricular tachycardia) (HCC) 07/05/2017  . Swelling 05/24/2017  . Spondylosis of lumbar region without myelopathy or radiculopathy 04/06/2017  . Abnormal myocardial perfusion study 03/22/2017  . GERD (gastroesophageal reflux disease) 10/05/2016  . Spondylolisthesis at L4-L5 level 09/06/2016  . Iron deficiency anemia 07/14/2016  . Risk for falls 04/05/2016  . Atypical chest pain 12/01/2015  . Carpal tunnel syndrome, left 09/28/2015  . Primary insomnia 05/27/2015  . High risk medication use 05/26/2015  . Palpitations 05/26/2015  . Asthma 05/24/2015  . IBS (irritable bowel syndrome) 05/24/2015  . Chronic obstructive pulmonary disease (HCC) 05/24/2015  . CKD (chronic kidney disease) 05/24/2015  . CAD in native artery 05/24/2015  . Diabetic polyneuropathy associated with type 2 diabetes mellitus (HCC) 05/24/2015  . Diastolic congestive heart failure (HCC) 05/24/2015  . Edema 05/24/2015  . Gout 05/24/2015  . Hallux valgus 05/24/2015   . Hammer toe 05/24/2015  . Hemorrhoids 05/24/2015  . Mixed hyperlipidemia 05/24/2015  . Hypokalemia 05/24/2015  . Hypothyroidism 05/24/2015  . Non morbid obesity due to excess calories 05/24/2015  . OSA on CPAP 05/24/2015  . Onychomycosis 05/24/2015  . Metabolic bone disease 05/24/2015  . Palpitation 05/24/2015  . Sarcoidosis 05/24/2015  . Tinea pedis of both feet 05/24/2015  . Vitamin D deficiency 05/24/2015  . Lumbar degenerative disc disease 02/07/2015  . Lumbar radiculopathy 02/07/2015  . Osteoarthritis of spine with radiculopathy, lumbar region 02/07/2015  . Acute right ankle pain 01/30/2015  . Abnormal gait 11/13/2014  . Chronic diastolic congestive heart failure (HCC) 10/23/2014  . Coronary artery disease involving native coronary artery of native heart without angina pectoris 10/23/2014  . Dyslipidemia 10/23/2014  . Benign hypertension with chronic kidney disease, stage III (HCC) 10/23/2014  . Type 2 diabetes mellitus without complication (HCC) 10/23/2014  . Left hip pain 10/17/2014  . Preop examination 10/17/2014  . Degenerative joint disease of pelvic region 08/20/2014  . Primary osteoarthritis of left hip 08/20/2014  . Stasis edema of both lower extremities 11/05/2013   Current Outpatient Medications on File Prior to Visit  Medication Sig Dispense Refill  . albuterol (PROVENTIL HFA;VENTOLIN HFA) 108 (90 BASE) MCG/ACT inhaler Inhale into the lungs every 6 (six) hours as needed for wheezing or shortness of breath.    . allopurinol (ZYLOPRIM) 300 MG tablet Take 300 mg by mouth daily.    Marland Kitchen. amiodarone (PACERONE) 200 MG tablet Take by mouth.    Marland Kitchen. amiodarone (PACERONE) 200 MG tablet     . apixaban (ELIQUIS) 2.5 MG TABS tablet  Take by mouth.    Marland Kitchen aspirin 81 MG tablet Take 81 mg by mouth daily.    Marland Kitchen azelastine (ASTELIN) 0.1 % nasal spray Place into both nostrils 2 (two) times daily. Use in each nostril as directed    . clindamycin (CLEOCIN) 300 MG capsule Take 300 mg by  mouth 4 (four) times daily.  0  . clotrimazole-betamethasone (LOTRISONE) cream APPLY TO AFFECTED AREA TWICE A DAY AS NEEDED    . clotrimazole-betamethasone (LOTRISONE) cream APPLY TO AFFECTED AREA TWICE A DAY AS NEEDED  2  . DILT-XR 240 MG 24 hr capsule Take 240 mg by mouth daily.  5  . diltiazem (CARDIZEM CD) 240 MG 24 hr capsule Take by mouth.    . ELIQUIS 2.5 MG TABS tablet     . EPINEPHrine (EPIPEN) 0.3 mg/0.3 mL IJ SOAJ injection Inject into the muscle once.    . Fe Fum-FA-B Cmp-C-Zn-Mg-Mn-Cu (FERROCITE PLUS) 106-1 MG TABS Take by mouth.    . Febuxostat (ULORIC) 80 MG TABS TAKE 1 TABLET DAILY    . FLUTICASONE PROPIONATE, INHAL, IN Inhale into the lungs.    . folic acid (FOLVITE) 1 MG tablet Take 1 mg by mouth daily.    Marland Kitchen gabapentin (NEURONTIN) 100 MG capsule Take by mouth.    Marland Kitchen HYDROcodone-acetaminophen (NORCO/VICODIN) 5-325 MG per tablet Take 1 tablet by mouth every 6 (six) hours as needed for moderate pain.    . Insulin Aspart (NOVOLOG FLEXPEN ) Inject into the skin.    Marland Kitchen insulin detemir (LEVEMIR) 100 UNIT/ML injection Inject 70 Units into the skin at bedtime.    . isosorbide mononitrate (IMDUR) 60 MG 24 hr tablet Take 60 mg by mouth daily.    Marland Kitchen levothyroxine (SYNTHROID, LEVOTHROID) 175 MCG tablet Take 175 mcg by mouth daily before breakfast.    . lubiprostone (AMITIZA) 8 MCG capsule TAKE 1 CAPSULE TWICE DAILY WITH MEALS.    . Magnesium Oxide 400 MG CAPS Take by mouth.    . metoprolol succinate (TOPROL-XL) 100 MG 24 hr tablet Take 100 mg by mouth daily. Take with or immediately following a meal.    . montelukast (SINGULAIR) 10 MG tablet Take by mouth.    . nitroGLYCERIN (NITROSTAT) 0.4 MG SL tablet Place under the tongue.    Marland Kitchen omeprazole (PRILOSEC) 40 MG capsule Take 40 mg by mouth daily.    Marland Kitchen oxyCODONE-acetaminophen (PERCOCET) 10-325 MG tablet Take by mouth.    . polyethylene glycol (MIRALAX / GLYCOLAX) packet Take 17 g by mouth daily.    . potassium chloride SA (K-DUR,KLOR-CON) 20  MEQ tablet     . ranitidine (ZANTAC) 300 MG tablet Take 300 mg by mouth at bedtime.    . ranolazine (RANEXA) 500 MG 12 hr tablet Take 500 mg by mouth 2 (two) times daily.    Marland Kitchen senna-docusate (SENOKOT-S) 8.6-50 MG tablet Take by mouth.    . spironolactone (ALDACTONE) 50 MG tablet TAKE 1 TABLET ONCE DAILY.    . tizanidine (ZANAFLEX) 2 MG capsule Take 2 mg by mouth 3 (three) times daily.    Marland Kitchen torsemide (DEMADEX) 100 MG tablet Take 100 mg by mouth daily.    Marland Kitchen triamcinolone cream (KENALOG) 0.1 %     . zolpidem (AMBIEN) 10 MG tablet Take 10 mg by mouth at bedtime as needed for sleep.     No current facility-administered medications on file prior to visit.    Allergies  Allergen Reactions  . Shellfish Allergy Nausea Only    States shrimp  makes her sick.  Other reaction(s): NAUSEA, VOMITING States shrimp makes her sick.  Other reaction(s): NAUSEA, VOMITING  . Marcelline Deist [Dapagliflozin] Nausea Only and Other (See Comments)    Bloating  . Metformin Nausea And Vomiting and Other (See Comments)    Bloating Stomach pain  . Metformin And Related   . Pioglitazone Other (See Comments)    Bloating Unknown  . Shellfish-Derived Products     States shrimp makes her sick.  Other reaction(s): NAUSEA, VOMITING  . Victoza [Liraglutide] Diarrhea and Other (See Comments)    Problems with pancreas    No results found for this or any previous visit (from the past 2160 hour(s)).  Objective: General: Patient is awake, alert, and oriented x 3 and in no acute distress.  Integument: Skin is warm, dry and supple bilateral. Nails are tender, long, thickened and dystrophic with subungual debris, consistent with onychomycosis, 1-5 bilateral. No signs of infection. + minimal corn at bilateral 5th toes. Remaining integument unremarkable.  Vasculature:  Dorsalis Pedis pulse 1/4 bilateral. Posterior Tibial pulse  0/4 bilateral due to trace edema at ankles. Capillary fill time <3 sec 1-5 bilateral. Scant hair growth to  the level of the digits.Temperature gradient within normal limits. No varicosities present bilateral, + hyperpigmentation bilateral.   Neurology: The patient has diminished sensation measured with a 5.07/10g Semmes Weinstein Monofilament at all pedal sites bilateral . Vibratory sensation diminished bilateral with tuning fork. No Babinski sign present bilateral.   Musculoskeletal: Subjective pain to both feet and legs to the level of the knees.  Pes planus and mild hammetoe pedal deformities noted bilateral. Muscular strength 5/5 in all lower extremity muscular groups bilateral without pain on range of motion . No tenderness with calf compression bilateral.  Assessment and Plan: Problem List Items Addressed This Visit      Endocrine   Diabetic polyneuropathy associated with type 2 diabetes mellitus (HCC)    Other Visit Diagnoses    Pain due to onychomycosis of toenail    -  Primary   PVD (peripheral vascular disease) (HCC)       Hammertoes of both feet       Arthritis of foot       Relevant Medications   oxyCODONE-acetaminophen (PERCOCET) 10-325 MG tablet   Corns and callosities         -Examined patient. -Discussed and educated patient on diabetic foot care, especially with  regards to the vascular, neurological and musculoskeletal systems.  -Mechanically smoothed corns at 5th toes using rotary bur and mechanically debrided all nails 1-5 bilateral using sterile nail nipper and filed with dremel without incident  -Advised patient to continue with recs from PCP for neuropathy  -Continue with cane for stability in gait -Answered all patient questions -Patient to return in 3 months for at risk foot care -Patient advised to call the office if any problems or questions arise in the meantime.  Asencion Islam, DPM

## 2018-08-10 ENCOUNTER — Ambulatory Visit: Payer: Medicare Other | Admitting: Sports Medicine

## 2018-12-25 DEATH — deceased
